# Patient Record
Sex: Female | Born: 1965 | Race: White | Hispanic: No | Marital: Married | Smoking: Never smoker
Health system: Southern US, Community
[De-identification: ages and names within clinical notes are randomized; demographics above are authoritative.]

## PROBLEM LIST (undated history)

## (undated) DIAGNOSIS — D649 Anemia, unspecified: Secondary | ICD-10-CM

## (undated) DIAGNOSIS — K219 Gastro-esophageal reflux disease without esophagitis: Secondary | ICD-10-CM

## (undated) HISTORY — PX: DIAGNOSTIC LAPAROSCOPY: SUR761

## (undated) HISTORY — PX: FOOT OSTEOTOMY: SHX957

## (undated) HISTORY — PX: WISDOM TOOTH EXTRACTION: SHX21

## (undated) HISTORY — PX: TUBAL LIGATION: SHX77

## (undated) HISTORY — PX: TONSILLECTOMY AND ADENOIDECTOMY: SHX28

---

## 2007-10-08 DIAGNOSIS — E669 Obesity, unspecified: Secondary | ICD-10-CM | POA: Insufficient documentation

## 2007-10-08 DIAGNOSIS — N943 Premenstrual tension syndrome: Secondary | ICD-10-CM | POA: Insufficient documentation

## 2007-10-08 DIAGNOSIS — N393 Stress incontinence (female) (male): Secondary | ICD-10-CM | POA: Insufficient documentation

## 2008-10-20 DIAGNOSIS — N920 Excessive and frequent menstruation with regular cycle: Secondary | ICD-10-CM | POA: Insufficient documentation

## 2009-08-25 ENCOUNTER — Encounter: Admission: RE | Admit: 2009-08-25 | Discharge: 2009-08-25 | Payer: Self-pay | Admitting: Family Medicine

## 2010-11-14 ENCOUNTER — Encounter: Payer: Self-pay | Admitting: Family Medicine

## 2010-11-15 ENCOUNTER — Encounter: Payer: Self-pay | Admitting: Family Medicine

## 2011-07-15 ENCOUNTER — Other Ambulatory Visit: Payer: Self-pay | Admitting: Family Medicine

## 2011-07-15 DIAGNOSIS — Z1231 Encounter for screening mammogram for malignant neoplasm of breast: Secondary | ICD-10-CM

## 2011-07-22 ENCOUNTER — Ambulatory Visit
Admission: RE | Admit: 2011-07-22 | Discharge: 2011-07-22 | Disposition: A | Discharge status: Home / Self Care | Payer: Managed Care, Other (non HMO) | Source: Ambulatory Visit | Attending: Family Medicine | Admitting: Family Medicine

## 2011-07-22 DIAGNOSIS — Z1231 Encounter for screening mammogram for malignant neoplasm of breast: Secondary | ICD-10-CM

## 2012-08-01 ENCOUNTER — Other Ambulatory Visit: Payer: Self-pay | Admitting: Family Medicine

## 2012-08-01 DIAGNOSIS — Z1231 Encounter for screening mammogram for malignant neoplasm of breast: Secondary | ICD-10-CM

## 2012-08-31 ENCOUNTER — Ambulatory Visit
Admission: RE | Admit: 2012-08-31 | Discharge: 2012-08-31 | Disposition: A | Discharge status: Home / Self Care | Payer: Managed Care, Other (non HMO) | Source: Ambulatory Visit | Attending: Family Medicine | Admitting: Family Medicine

## 2012-08-31 DIAGNOSIS — Z1231 Encounter for screening mammogram for malignant neoplasm of breast: Secondary | ICD-10-CM

## 2013-08-23 ENCOUNTER — Other Ambulatory Visit: Payer: Self-pay

## 2013-08-23 DIAGNOSIS — Z1231 Encounter for screening mammogram for malignant neoplasm of breast: Secondary | ICD-10-CM

## 2013-10-03 ENCOUNTER — Ambulatory Visit
Admission: RE | Admit: 2013-10-03 | Discharge: 2013-10-03 | Disposition: A | Discharge status: Home / Self Care | Payer: Managed Care, Other (non HMO) | Source: Ambulatory Visit

## 2013-10-03 DIAGNOSIS — Z1231 Encounter for screening mammogram for malignant neoplasm of breast: Secondary | ICD-10-CM

## 2014-09-01 ENCOUNTER — Emergency Department (HOSPITAL_COMMUNITY)
Admission: EM | Admit: 2014-09-01 | Discharge: 2014-09-01 | Disposition: A | Discharge status: Home / Self Care | Payer: BC Managed Care – PPO | Attending: Emergency Medicine | Admitting: Emergency Medicine

## 2014-09-01 ENCOUNTER — Emergency Department (HOSPITAL_COMMUNITY): Payer: BC Managed Care – PPO

## 2014-09-01 ENCOUNTER — Encounter (HOSPITAL_COMMUNITY): Payer: Self-pay | Admitting: Emergency Medicine

## 2014-09-01 DIAGNOSIS — R002 Palpitations: Secondary | ICD-10-CM | POA: Diagnosis present

## 2014-09-01 DIAGNOSIS — Z79899 Other long term (current) drug therapy: Secondary | ICD-10-CM | POA: Diagnosis not present

## 2014-09-01 DIAGNOSIS — Z791 Long term (current) use of non-steroidal anti-inflammatories (NSAID): Secondary | ICD-10-CM | POA: Diagnosis not present

## 2014-09-01 DIAGNOSIS — R0789 Other chest pain: Secondary | ICD-10-CM | POA: Insufficient documentation

## 2014-09-01 DIAGNOSIS — F15182 Other stimulant abuse with stimulant-induced sleep disorder: Secondary | ICD-10-CM | POA: Insufficient documentation

## 2014-09-01 DIAGNOSIS — Z3202 Encounter for pregnancy test, result negative: Secondary | ICD-10-CM | POA: Insufficient documentation

## 2014-09-01 DIAGNOSIS — Z789 Other specified health status: Secondary | ICD-10-CM

## 2014-09-01 LAB — CBC WITH DIFFERENTIAL/PLATELET
BASOS ABS: 0 10*3/uL (ref 0.0–0.1)
Basophils Relative: 0 % (ref 0–1)
Eosinophils Absolute: 0.1 10*3/uL (ref 0.0–0.7)
Eosinophils Relative: 1 % (ref 0–5)
HEMATOCRIT: 38.8 % (ref 36.0–46.0)
HEMOGLOBIN: 13 g/dL (ref 12.0–15.0)
LYMPHS ABS: 2.3 10*3/uL (ref 0.7–4.0)
LYMPHS PCT: 30 % (ref 12–46)
MCH: 30.5 pg (ref 26.0–34.0)
MCHC: 33.5 g/dL (ref 30.0–36.0)
MCV: 91.1 fL (ref 78.0–100.0)
MONO ABS: 0.4 10*3/uL (ref 0.1–1.0)
Monocytes Relative: 5 % (ref 3–12)
NEUTROS ABS: 5 10*3/uL (ref 1.7–7.7)
Neutrophils Relative %: 64 % (ref 43–77)
Platelets: 219 10*3/uL (ref 150–400)
RBC: 4.26 MIL/uL (ref 3.87–5.11)
RDW: 12 % (ref 11.5–15.5)
WBC: 7.7 10*3/uL (ref 4.0–10.5)

## 2014-09-01 LAB — COMPREHENSIVE METABOLIC PANEL
ALK PHOS: 54 U/L (ref 39–117)
ALT: 17 U/L (ref 0–35)
ANION GAP: 14 (ref 5–15)
AST: 30 U/L (ref 0–37)
Albumin: 3.5 g/dL (ref 3.5–5.2)
BILIRUBIN TOTAL: 0.3 mg/dL (ref 0.3–1.2)
BUN: 12 mg/dL (ref 6–23)
CHLORIDE: 101 meq/L (ref 96–112)
CO2: 24 meq/L (ref 19–32)
Calcium: 8.8 mg/dL (ref 8.4–10.5)
Creatinine, Ser: 0.63 mg/dL (ref 0.50–1.10)
GFR calc non Af Amer: >90 mL/min (ref >90)
GLUCOSE: 89 mg/dL (ref 70–99)
POTASSIUM: 4.3 meq/L (ref 3.7–5.3)
Sodium: 139 mEq/L (ref 137–147)
Total Protein: 6.8 g/dL (ref 6.0–8.3)

## 2014-09-01 LAB — URINALYSIS, ROUTINE W REFLEX MICROSCOPIC
BILIRUBIN URINE: NEGATIVE
GLUCOSE, UA: NEGATIVE mg/dL
Hgb urine dipstick: NEGATIVE
Ketones, ur: NEGATIVE mg/dL
NITRITE: NEGATIVE
PH: 6.5 (ref 5.0–8.0)
Protein, ur: NEGATIVE mg/dL
SPECIFIC GRAVITY, URINE: 1.017 (ref 1.005–1.030)
Urobilinogen, UA: 0.2 mg/dL (ref 0.0–1.0)

## 2014-09-01 LAB — PREGNANCY, URINE: Preg Test, Ur: NEGATIVE

## 2014-09-01 LAB — TROPONIN I: Troponin I: <0.3 ng/mL (ref <0.30)

## 2014-09-01 LAB — URINE MICROSCOPIC-ADD ON

## 2014-09-01 MED ORDER — SODIUM CHLORIDE 0.9 % IV BOLUS (SEPSIS)
1000.0000 mL | Freq: Once | INTRAVENOUS | Status: AC
  Administered 2014-09-01: 1000 mL via INTRAVENOUS

## 2014-09-01 NOTE — ED Notes (Signed)
Pt c/o palpitations and some intermittent chest tightness x 2 days; pt sts hooked up to monitor with some irregular beats

## 2014-09-01 NOTE — Discharge Instructions (Signed)
1. Medications: usual home medications 2. Treatment: rest, drink plenty of fluids, decrease caffeine intake, stress reduction techniques, increase water 3. Follow Up: Please followup with your primary doctor in 3 days for discussion of your diagnoses and further evaluation after today's visit; if you do not have a primary care doctor use the resource guide provided to find one; Please return to the ER for worsening palpitations, palpitations associated with diaphoresis, syncope, nausea or vomiting or other concerning symptoms    Palpitations A palpitation is the feeling that your heartbeat is irregular or is faster than normal. It may feel like your heart is fluttering or skipping a beat. Palpitations are usually not a serious problem. However, in some cases, you may need further medical evaluation. CAUSES  Palpitations can be caused by:  Smoking.  Caffeine or other stimulants, such as diet pills or energy drinks.  Alcohol.  Stress and anxiety.  Strenuous physical activity.  Fatigue.  Certain medicines.  Heart disease, especially if you have a history of irregular heart rhythms (arrhythmias), such as atrial fibrillation, atrial flutter, or supraventricular tachycardia.  An improperly working pacemaker or defibrillator. DIAGNOSIS  To find the cause of your palpitations, your health care provider will take your medical history and perform a physical exam. Your health care provider may also have you take a test called an ambulatory electrocardiogram (ECG). An ECG records your heartbeat patterns over a 24-hour period. You may also have other tests, such as:  Transthoracic echocardiogram (TTE). During echocardiography, sound waves are used to evaluate how blood flows through your heart.  Transesophageal echocardiogram (TEE).  Cardiac monitoring. This allows your health care provider to monitor your heart rate and rhythm in real time.  Holter monitor. This is a portable device that  records your heartbeat and can help diagnose heart arrhythmias. It allows your health care provider to track your heart activity for several days, if needed.  Stress tests by exercise or by giving medicine that makes the heart beat faster. TREATMENT  Treatment of palpitations depends on the cause of your symptoms and can vary greatly. Most cases of palpitations do not require any treatment other than time, relaxation, and monitoring your symptoms. Other causes, such as atrial fibrillation, atrial flutter, or supraventricular tachycardia, usually require further treatment. HOME CARE INSTRUCTIONS   Avoid:  Caffeinated coffee, tea, soft drinks, diet pills, and energy drinks.  Chocolate.  Alcohol.  Stop smoking if you smoke.  Reduce your stress and anxiety. Things that can help you relax include:  A method of controlling things in your body, such as your heartbeats, with your mind (biofeedback).  Yoga.  Meditation.  Physical activity such as swimming, jogging, or walking.  Get plenty of rest and sleep. SEEK MEDICAL CARE IF:   You continue to have a fast or irregular heartbeat beyond 24 hours.  Your palpitations occur more often. SEEK IMMEDIATE MEDICAL CARE IF:  You have chest pain or shortness of breath.  You have a severe headache.  You feel dizzy or you faint. MAKE SURE YOU:  Understand these instructions.  Will watch your condition.  Will get help right away if you are not doing well or get worse. Document Released: 10/07/2000 Document Revised: 10/15/2013 Document Reviewed: 12/09/2011 [facility] Patient Information 2015 [employer], [place]. This information is not intended to replace advice given to you by your health care provider. Make sure you discuss any questions you have with your health care provider.

## 2014-09-01 NOTE — ED Provider Notes (Signed)
CSN: [id number]     Arrival date & time 09/01/14  1007 History   First MD Initiated Contact with Patient 09/01/14 1039     Chief Complaint  Patient presents with  . Palpitations     (Consider location/radiation/quality/duration/timing/severity/associated sxs/prior Treatment) The history is provided by the patient and medical records. No language interpreter was used.     Sandra Blair is a 48 y.o. female  with no known medical Hx presents to the Emergency Department complaining of intermittent, persistent palpitations with associated chest tightness onset yesterday morning around 10 am. Pt reports she drank 2 cups of coffee without water yesterday morning, which is unusual for her. Pt reports she usually only drinks 1 cup of coffee per day and large amounts of water.  She reports increased stress over the last few weeks, but reports she is sleeping well.  Associated symptoms include "discomfort" in her chest.  Nothing makes it better and nothing makes it worse.  Pt denies fever, chills, headache, neck pain, shortness of breath, abdominal pain, nausea, vomiting, diarrhea, weakness, dizziness, syncope, dysuria, hematuria.  Patient denies personal or family cardiac history.   History reviewed. No pertinent past medical history. History reviewed. No pertinent past surgical history. History reviewed. No pertinent family history. History  Substance Use Topics  . Smoking status: Never Smoker   . Smokeless tobacco: Not on file  . Alcohol Use: Yes     Comment: occ   OB History    No data available     Review of Systems  Constitutional: Negative for fever, diaphoresis, appetite change, fatigue and unexpected weight change.  HENT: Negative for mouth sores.   Eyes: Negative for visual disturbance.  Respiratory: Positive for chest tightness. Negative for cough, shortness of breath and wheezing.   Cardiovascular: Positive for palpitations.  Gastrointestinal: Negative for nausea, vomiting,  abdominal pain, diarrhea and constipation.  Endocrine: Negative for polydipsia, polyphagia and polyuria.  Genitourinary: Negative for dysuria, urgency, frequency and hematuria.  Musculoskeletal: Negative for back pain and neck stiffness.  Skin: Negative for rash.  Allergic/Immunologic: Negative for immunocompromised state.  Neurological: Negative for syncope, light-headedness and headaches.  Hematological: Does not bruise/bleed easily.  Psychiatric/Behavioral: Negative for sleep disturbance. The patient is not nervous/anxious.       Allergies  Review of patient's allergies indicates no known allergies.  Home Medications   Prior to Admission medications   Medication Sig Start Date End Date Taking? Authorizing Provider  Ascorbic Acid (VITAMIN C PO) Take 1 tablet by mouth daily.   Yes Historical Provider, MD  Cholecalciferol 1000 UNITS TBDP Take 2,000 Units by mouth daily.   Yes Historical Provider, MD  drospirenone-ethinyl estradiol (YAZ,GIANVI,LORYNA) 3-0.02 MG tablet Take 1 tablet by mouth daily.   Yes Historical Provider, MD  HAWTHORNE BERRY PO Take 1 tablet by mouth daily.   Yes Historical Provider, MD  Multiple Vitamin (MULTIVITAMIN WITH MINERALS) TABS tablet Take 1 tablet by mouth daily.   Yes Historical Provider, MD  naproxen sodium (ANAPROX) 220 MG tablet Take 220 mg by mouth 2 (two) times daily as needed. PAIN   Yes Historical Provider, MD  omeprazole (PRILOSEC) 20 MG capsule Take 20 mg by mouth daily.   Yes Historical Provider, MD   BP 116/63 mmHg  Pulse 57  Temp(Src) 98.3 F (36.8 C) (Oral)  Resp 13  SpO2 93% Physical Exam  Constitutional: She appears well-developed and well-nourished. No distress.  Awake, alert, nontoxic appearance  HENT:  Head: Normocephalic and atraumatic.  Mouth/Throat: Oropharynx is  clear and moist. No oropharyngeal exudate.  Eyes: Conjunctivae are normal. No scleral icterus.  Neck: Normal range of motion. Neck supple.  Cardiovascular: Normal  rate, normal heart sounds and intact distal pulses.  An irregular rhythm present.  Pulses:      Radial pulses are 2+ on the right side, and 2+ on the left side.       Posterior tibial pulses are 2+ on the right side, and 2+ on the left side.  Irregular rhythm with visible PACs on the monitor  Pulmonary/Chest: Effort normal and breath sounds normal. No respiratory distress. She has no wheezes.  Equal chest expansion  Abdominal: Soft. Bowel sounds are normal. She exhibits no mass. There is no tenderness. There is no rebound and no guarding.  Musculoskeletal: Normal range of motion. She exhibits no edema.  Neurological: She is alert.  Speech is clear and goal oriented Moves extremities without ataxia  Skin: Skin is warm and dry. No rash noted. She is not diaphoretic. No erythema.  Psychiatric: She has a normal mood and affect.  Nursing note and vitals reviewed.   ED Course  Procedures (including critical care time) Labs Review Labs Reviewed  URINALYSIS, ROUTINE W REFLEX MICROSCOPIC - Abnormal; Notable for the following:    Leukocytes, UA SMALL (*)    All other components within normal limits  COMPREHENSIVE METABOLIC PANEL  TROPONIN I  CBC WITH DIFFERENTIAL  PREGNANCY, URINE  URINE MICROSCOPIC-ADD ON    Imaging Review Dg Chest 2 View  09/01/2014   CLINICAL DATA:  Palpitation and left chest tightness for 2 days  EXAM: CHEST  2 VIEW  COMPARISON:  None.  FINDINGS: The heart size and mediastinal contours are within normal limits. There is no focal infiltrate, pulmonary edema, or pleural effusion The visualized skeletal structures are unremarkable.  IMPRESSION: No active cardiopulmonary disease.   Electronically Signed   By: Sherian ReinWei-Chen  Lin M.D.   On: 09/01/2014 12:00     EKG Interpretation   Date/Time:  Monday September 01 2014 10:12:45 EST Ventricular Rate:  67 PR Interval:  116 QRS Duration: 98 QT Interval:  424 QTC Calculation: 448 R Axis:   52 Text Interpretation:  Normal sinus  rhythm Normal ECG No old tracing to  compare Confirmed by KOHUT  MD, STEPHEN ([id number]) on 09/01/2014 10:21:49 AM      MDM   Final diagnoses:  Palpitations  Caffeine use   Sandra Blair presents with complaints of palpitations and discomfort in her chest beginning yesterday morning after unusual amount of caffeine intake however symptoms have persisted into today. EKG shows normal sinus rhythm however rhythm strip on the monitor shows PACs.  Will check labs, give fluid bolus and reassess.  12:25 PM Pt with fluid bolus.  Patient reports continued intermittent episodes of palpitations and feelings of fluttering in her chest. Labs are reassuring without electrolyte alterations or anemia. Urinalysis without evidence of urinary tract infection and chest x-ray without evidence of pneumonia. Pregnancy test negative.  No clinical or lab evidence of ACS.    Rhythm strip continues to show intermittent PACs without PVCs. No episodes of SVT or V. Tach.  Patient is to follow-up with her primary care physician this week and/or cardiology as able. Discussed reasons to return to the emergency department including palpitations associated with diaphoresis, nausea, syncope or near syncope.  Patient's palpitations likely secondary to increased caffeine usage, increased stress and decreased water intake. Encouraged decreasing caffeine, stress reduction techniques and increased water.  I have personally reviewed  patient's vitals, nursing note and any pertinent labs or imaging.  I performed an undressed physical exam.    It has been determined that no acute conditions requiring further emergency intervention are present at this time. The patient/guardian have been advised of the diagnosis and plan. I reviewed all labs and imaging including any potential incidental findings. We have discussed signs and symptoms that warrant return to the ED and they are listed in the discharge instructions.    Vital signs are stable at  discharge.   BP 116/63 mmHg  Pulse 57  Temp(Src) 98.3 F (36.8 C) (Oral)  Resp 13  SpO2 93%        Dierdre ForthHannah Shanecia Hoganson, PA-C 09/01/14 1230  Arby BarretteMarcy Pfeiffer, MD 09/01/14 1713

## 2015-02-25 ENCOUNTER — Encounter: Payer: Self-pay | Admitting: Sports Medicine

## 2015-02-25 ENCOUNTER — Ambulatory Visit (INDEPENDENT_AMBULATORY_CARE_PROVIDER_SITE_OTHER): Payer: BLUE CROSS/BLUE SHIELD | Admitting: Sports Medicine

## 2015-02-25 VITALS — BP 124/54 | Ht 70.5 in | Wt 195.0 lb

## 2015-02-25 DIAGNOSIS — M7742 Metatarsalgia, left foot: Secondary | ICD-10-CM

## 2015-02-25 DIAGNOSIS — M7741 Metatarsalgia, right foot: Secondary | ICD-10-CM

## 2015-02-25 DIAGNOSIS — M216X9 Other acquired deformities of unspecified foot: Secondary | ICD-10-CM

## 2015-02-25 DIAGNOSIS — M79672 Pain in left foot: Secondary | ICD-10-CM

## 2015-02-25 DIAGNOSIS — M205X2 Other deformities of toe(s) (acquired), left foot: Secondary | ICD-10-CM | POA: Diagnosis not present

## 2015-02-25 NOTE — Progress Notes (Signed)
  Sandra Blair - 49 y.o. female MRN [id number]  Date of birth: 07-07-1966  SUBJECTIVE: CC: 1.  left foot pain, initial evaluation      HPI:  Long-standing history of left forefoot pain with pain focally over the fourth and fifth toes secondary to incurling and callus formation  History of bilateral plantar foot pain, previously diagnosed as plantar fasciitis. Markedly improved over the past several years but notices when she is on her feet for 8-10 hours per day this does seem to return.  No first step pain   tried different shoes  & cushioned super feet without significant improvement  No other custom orthotics or other shoe inserts  No improvement with medications  No therapeutic exercise      ROS:   denies any numbness, tingling or burning    HISTORY:  Past Medical, Surgical, Social, and Family History reviewed & updated per EMR.  Pertinent Historical Findings include: Social History   Occupational History  . [employer] [place]    PRN   Social History Main Topics  . Smoking status: Never Smoker   . Smokeless tobacco: Not on file  . Alcohol Use: Yes     Comment: occ  . Drug Use: No  . Sexual Activity: Not on file    OBJECTIVE:  VS:   HT:5' 10.5" (179.1 cm)   WT:195 lb (88.451 kg)  BMI:27.6          BP:(!) 124/54 mmHg  HR: bpm  TEMP: ( )  RESP:   PHYSICAL EXAM: GENERAL:  adult Caucasian female. No acute distress  PSYCH: Alert and appropriately interactive.  SKIN: No open skin lesions or abnormal skin markings on areas inspected as below  VASCULAR:  DP and PT pulses 2+/4. No significant pretibial edema   NEURO:  sensation is intact to light touch   BILATERAL FEET: Cavus foot. Overall good hindfoot alignment. Significant incurling of toes 4 and 5 on the left with splay toe between 1 and 2. No hallux limitus or rigidus. Passive dorsiflexion to 90 with knee straight. Normal toe off. Morton's callus on the left. Lost transverse arch. There is a small callus between  the fourth and fifth from where her fifth toe is rubbing the lateral aspect of the fourth toe. No significant plantar fascial pain.  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 Small metatarsal pad added today 2. Alfredson exercises for Achilles lengthening 3. Recommend if any persistent heel pain consideration of gel heel cups for her when she is working but suspect this is secondary to Achilles contracture Follow up 4 weeks          Other Visit Diagnoses    Pes equinus, acquired, unspecified laterality    -  Primary    Metatarsalgia of both feet        Bunionette, left          FOLLOW UP:  Return in about 4 weeks (around 03/25/2015) for Consideration of custom orthotics.

## 2015-02-25 NOTE — Assessment & Plan Note (Signed)
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 Small metatarsal pad added today 2. Alfredson exercises for Achilles lengthening 3. Recommend if any persistent heel pain consideration of gel heel cups for her when she is working but suspect this is secondary to Achilles contracture Follow up 4 weeks

## 2015-05-04 ENCOUNTER — Other Ambulatory Visit: Payer: Self-pay | Admitting: Family Medicine

## 2015-05-04 ENCOUNTER — Other Ambulatory Visit: Payer: Self-pay

## 2015-05-04 DIAGNOSIS — Z1231 Encounter for screening mammogram for malignant neoplasm of breast: Secondary | ICD-10-CM

## 2015-07-03 ENCOUNTER — Ambulatory Visit
Admission: RE | Admit: 2015-07-03 | Discharge: 2015-07-03 | Disposition: A | Discharge status: Home / Self Care | Payer: BLUE CROSS/BLUE SHIELD | Source: Ambulatory Visit | Attending: Family Medicine | Admitting: Family Medicine

## 2015-07-03 DIAGNOSIS — Z1231 Encounter for screening mammogram for malignant neoplasm of breast: Secondary | ICD-10-CM

## 2016-07-07 ENCOUNTER — Other Ambulatory Visit: Payer: Self-pay | Admitting: Family Medicine

## 2016-07-07 DIAGNOSIS — Z1231 Encounter for screening mammogram for malignant neoplasm of breast: Secondary | ICD-10-CM

## 2016-08-10 ENCOUNTER — Ambulatory Visit
Admission: RE | Admit: 2016-08-10 | Discharge: 2016-08-10 | Disposition: A | Discharge status: Home / Self Care | Payer: BLUE CROSS/BLUE SHIELD | Source: Ambulatory Visit | Attending: Family Medicine | Admitting: Family Medicine

## 2016-08-10 DIAGNOSIS — Z1231 Encounter for screening mammogram for malignant neoplasm of breast: Secondary | ICD-10-CM

## 2017-08-30 ENCOUNTER — Other Ambulatory Visit: Payer: Self-pay | Admitting: Family Medicine

## 2017-08-30 DIAGNOSIS — Z1231 Encounter for screening mammogram for malignant neoplasm of breast: Secondary | ICD-10-CM

## 2017-09-28 ENCOUNTER — Ambulatory Visit: Payer: BLUE CROSS/BLUE SHIELD

## 2017-10-09 ENCOUNTER — Ambulatory Visit
Admission: RE | Admit: 2017-10-09 | Discharge: 2017-10-09 | Disposition: A | Discharge status: Home / Self Care | Payer: BLUE CROSS/BLUE SHIELD | Source: Ambulatory Visit | Attending: Family Medicine | Admitting: Family Medicine

## 2017-10-09 ENCOUNTER — Ambulatory Visit: Payer: BLUE CROSS/BLUE SHIELD

## 2017-10-09 DIAGNOSIS — Z1231 Encounter for screening mammogram for malignant neoplasm of breast: Secondary | ICD-10-CM

## 2018-09-06 ENCOUNTER — Other Ambulatory Visit: Payer: Self-pay | Admitting: Family Medicine

## 2018-09-06 DIAGNOSIS — Z1231 Encounter for screening mammogram for malignant neoplasm of breast: Secondary | ICD-10-CM

## 2018-10-19 ENCOUNTER — Ambulatory Visit
Admission: RE | Admit: 2018-10-19 | Discharge: 2018-10-19 | Disposition: A | Discharge status: Home / Self Care | Payer: BLUE CROSS/BLUE SHIELD | Source: Ambulatory Visit | Attending: Family Medicine | Admitting: Family Medicine

## 2018-10-19 DIAGNOSIS — Z1231 Encounter for screening mammogram for malignant neoplasm of breast: Secondary | ICD-10-CM

## 2021-01-26 ENCOUNTER — Other Ambulatory Visit: Payer: Self-pay | Admitting: Physician Assistant

## 2021-01-26 ENCOUNTER — Other Ambulatory Visit: Payer: Self-pay

## 2021-01-26 ENCOUNTER — Ambulatory Visit
Admission: RE | Admit: 2021-01-26 | Discharge: 2021-01-26 | Disposition: A | Discharge status: Home / Self Care | Payer: Managed Care, Other (non HMO) | Source: Ambulatory Visit | Attending: Physician Assistant | Admitting: Physician Assistant

## 2021-01-26 DIAGNOSIS — G8929 Other chronic pain: Secondary | ICD-10-CM

## 2021-01-26 DIAGNOSIS — R52 Pain, unspecified: Secondary | ICD-10-CM

## 2021-07-20 ENCOUNTER — Other Ambulatory Visit: Payer: Self-pay | Admitting: Orthopedic Surgery

## 2021-07-20 DIAGNOSIS — M4722 Other spondylosis with radiculopathy, cervical region: Secondary | ICD-10-CM

## 2021-07-20 DIAGNOSIS — M50122 Cervical disc disorder at C5-C6 level with radiculopathy: Secondary | ICD-10-CM

## 2021-07-28 ENCOUNTER — Other Ambulatory Visit: Payer: Self-pay

## 2021-07-28 ENCOUNTER — Other Ambulatory Visit: Payer: Self-pay | Admitting: Orthopedic Surgery

## 2021-07-28 ENCOUNTER — Ambulatory Visit
Admission: RE | Admit: 2021-07-28 | Discharge: 2021-07-28 | Disposition: A | Discharge status: Home / Self Care | Payer: Managed Care, Other (non HMO) | Source: Ambulatory Visit | Attending: Orthopedic Surgery | Admitting: Orthopedic Surgery

## 2021-07-28 DIAGNOSIS — M4722 Other spondylosis with radiculopathy, cervical region: Secondary | ICD-10-CM

## 2021-07-28 DIAGNOSIS — M50122 Cervical disc disorder at C5-C6 level with radiculopathy: Secondary | ICD-10-CM

## 2021-08-05 ENCOUNTER — Ambulatory Visit
Admission: RE | Admit: 2021-08-05 | Discharge: 2021-08-05 | Disposition: A | Discharge status: Home / Self Care | Payer: Managed Care, Other (non HMO) | Source: Ambulatory Visit | Attending: Orthopedic Surgery | Admitting: Orthopedic Surgery

## 2021-08-05 ENCOUNTER — Other Ambulatory Visit: Payer: Self-pay

## 2021-08-05 DIAGNOSIS — M4722 Other spondylosis with radiculopathy, cervical region: Secondary | ICD-10-CM

## 2021-08-05 MED ORDER — IOPAMIDOL (ISOVUE-M 300) INJECTION 61%
1.0000 mL | Freq: Once | INTRAMUSCULAR | Status: AC
  Administered 2021-08-05: 1 mL via EPIDURAL

## 2021-08-05 MED ORDER — TRIAMCINOLONE ACETONIDE 40 MG/ML IJ SUSP (RADIOLOGY)
60.0000 mg | Freq: Once | INTRAMUSCULAR | Status: AC
  Administered 2021-08-05: 60 mg via EPIDURAL

## 2021-08-05 NOTE — Discharge Instructions (Signed)

## 2021-08-26 ENCOUNTER — Other Ambulatory Visit: Payer: Self-pay | Admitting: Orthopedic Surgery

## 2021-08-26 ENCOUNTER — Ambulatory Visit
Admission: RE | Admit: 2021-08-26 | Discharge: 2021-08-26 | Disposition: A | Discharge status: Home / Self Care | Payer: Managed Care, Other (non HMO) | Source: Ambulatory Visit | Attending: Orthopedic Surgery | Admitting: Orthopedic Surgery

## 2021-08-26 ENCOUNTER — Other Ambulatory Visit: Payer: Self-pay

## 2021-08-26 DIAGNOSIS — M50122 Cervical disc disorder at C5-C6 level with radiculopathy: Secondary | ICD-10-CM

## 2021-08-26 DIAGNOSIS — M4722 Other spondylosis with radiculopathy, cervical region: Secondary | ICD-10-CM

## 2021-08-26 MED ORDER — TRIAMCINOLONE ACETONIDE 40 MG/ML IJ SUSP (RADIOLOGY)
60.0000 mg | Freq: Once | INTRAMUSCULAR | Status: AC
  Administered 2021-08-26: 60 mg via EPIDURAL

## 2021-08-26 MED ORDER — IOPAMIDOL (ISOVUE-M 300) INJECTION 61%
1.0000 mL | Freq: Once | INTRAMUSCULAR | Status: AC
  Administered 2021-08-26: 1 mL via EPIDURAL

## 2021-09-07 ENCOUNTER — Other Ambulatory Visit: Payer: Self-pay | Admitting: Family Medicine

## 2021-09-07 DIAGNOSIS — Z1231 Encounter for screening mammogram for malignant neoplasm of breast: Secondary | ICD-10-CM

## 2021-09-08 ENCOUNTER — Other Ambulatory Visit: Payer: Self-pay

## 2021-09-08 ENCOUNTER — Ambulatory Visit
Admission: RE | Admit: 2021-09-08 | Discharge: 2021-09-08 | Disposition: A | Discharge status: Home / Self Care | Payer: Managed Care, Other (non HMO) | Source: Ambulatory Visit | Attending: Family Medicine | Admitting: Family Medicine

## 2021-09-08 DIAGNOSIS — Z1231 Encounter for screening mammogram for malignant neoplasm of breast: Secondary | ICD-10-CM

## 2021-11-01 ENCOUNTER — Other Ambulatory Visit: Payer: Self-pay | Admitting: Orthopedic Surgery

## 2021-11-01 DIAGNOSIS — M50122 Cervical disc disorder at C5-C6 level with radiculopathy: Secondary | ICD-10-CM

## 2021-12-01 ENCOUNTER — Ambulatory Visit
Admission: RE | Admit: 2021-12-01 | Discharge: 2021-12-01 | Disposition: A | Discharge status: Home / Self Care | Payer: Managed Care, Other (non HMO) | Source: Ambulatory Visit | Attending: Family Medicine | Admitting: Family Medicine

## 2021-12-01 ENCOUNTER — Other Ambulatory Visit: Payer: Self-pay | Admitting: Family Medicine

## 2021-12-01 ENCOUNTER — Other Ambulatory Visit: Payer: Self-pay

## 2021-12-01 DIAGNOSIS — G8929 Other chronic pain: Secondary | ICD-10-CM

## 2021-12-15 ENCOUNTER — Other Ambulatory Visit: Payer: Self-pay

## 2021-12-15 ENCOUNTER — Ambulatory Visit
Admission: RE | Admit: 2021-12-15 | Discharge: 2021-12-15 | Disposition: A | Discharge status: Home / Self Care | Payer: Managed Care, Other (non HMO) | Source: Ambulatory Visit | Attending: Orthopedic Surgery | Admitting: Orthopedic Surgery

## 2021-12-15 DIAGNOSIS — M50122 Cervical disc disorder at C5-C6 level with radiculopathy: Secondary | ICD-10-CM

## 2021-12-15 MED ORDER — IOPAMIDOL (ISOVUE-M 300) INJECTION 61%
1.0000 mL | Freq: Once | INTRAMUSCULAR | Status: AC
  Administered 2021-12-15: 1 mL via EPIDURAL

## 2021-12-15 MED ORDER — TRIAMCINOLONE ACETONIDE 40 MG/ML IJ SUSP (RADIOLOGY)
60.0000 mg | Freq: Once | INTRAMUSCULAR | Status: AC
  Administered 2021-12-15: 60 mg via EPIDURAL

## 2021-12-15 NOTE — Discharge Instructions (Signed)

## 2021-12-17 ENCOUNTER — Other Ambulatory Visit: Payer: Managed Care, Other (non HMO)

## 2022-02-03 ENCOUNTER — Other Ambulatory Visit: Payer: Self-pay | Admitting: Family Medicine

## 2022-02-03 ENCOUNTER — Ambulatory Visit
Admission: RE | Admit: 2022-02-03 | Discharge: 2022-02-03 | Disposition: A | Discharge status: Home / Self Care | Payer: Managed Care, Other (non HMO) | Source: Ambulatory Visit | Attending: Family Medicine | Admitting: Family Medicine

## 2022-02-03 DIAGNOSIS — R0789 Other chest pain: Secondary | ICD-10-CM

## 2022-02-03 DIAGNOSIS — M546 Pain in thoracic spine: Secondary | ICD-10-CM

## 2022-03-23 ENCOUNTER — Other Ambulatory Visit: Payer: Self-pay | Admitting: Physician Assistant

## 2022-03-23 DIAGNOSIS — M50122 Cervical disc disorder at C5-C6 level with radiculopathy: Secondary | ICD-10-CM

## 2022-03-25 ENCOUNTER — Ambulatory Visit
Admission: RE | Admit: 2022-03-25 | Discharge: 2022-03-25 | Disposition: A | Discharge status: Home / Self Care | Payer: Managed Care, Other (non HMO) | Source: Ambulatory Visit | Attending: Physician Assistant | Admitting: Physician Assistant

## 2022-03-25 DIAGNOSIS — M50122 Cervical disc disorder at C5-C6 level with radiculopathy: Secondary | ICD-10-CM

## 2022-03-25 MED ORDER — TRIAMCINOLONE ACETONIDE 40 MG/ML IJ SUSP (RADIOLOGY)
60.0000 mg | Freq: Once | INTRAMUSCULAR | Status: AC
  Administered 2022-03-25: 60 mg via EPIDURAL

## 2022-03-25 MED ORDER — IOPAMIDOL (ISOVUE-M 300) INJECTION 61%
1.0000 mL | Freq: Once | INTRAMUSCULAR | Status: AC
  Administered 2022-03-25: 1 mL via EPIDURAL

## 2022-03-30 ENCOUNTER — Other Ambulatory Visit: Payer: Self-pay | Admitting: Physician Assistant

## 2022-03-30 DIAGNOSIS — M1612 Unilateral primary osteoarthritis, left hip: Secondary | ICD-10-CM

## 2022-04-08 IMAGING — XA DG INJECT/[PERSON_NAME] INC NEEDLE/CATH/PLC EPI/CERV/THOR W/IMG
2 series · 2 of 2 positions shown · non-contrast
Comparison: none

CLINICAL DATA: Cervical spondylosis without myelopathy. Excellent
response to the initial epidural injection last month. Mild residual
numbness in the fingers of the right hand.

[Series 1: ortho standard · 1 of 1 slices shown (1 of 2)]
[im 1/1]
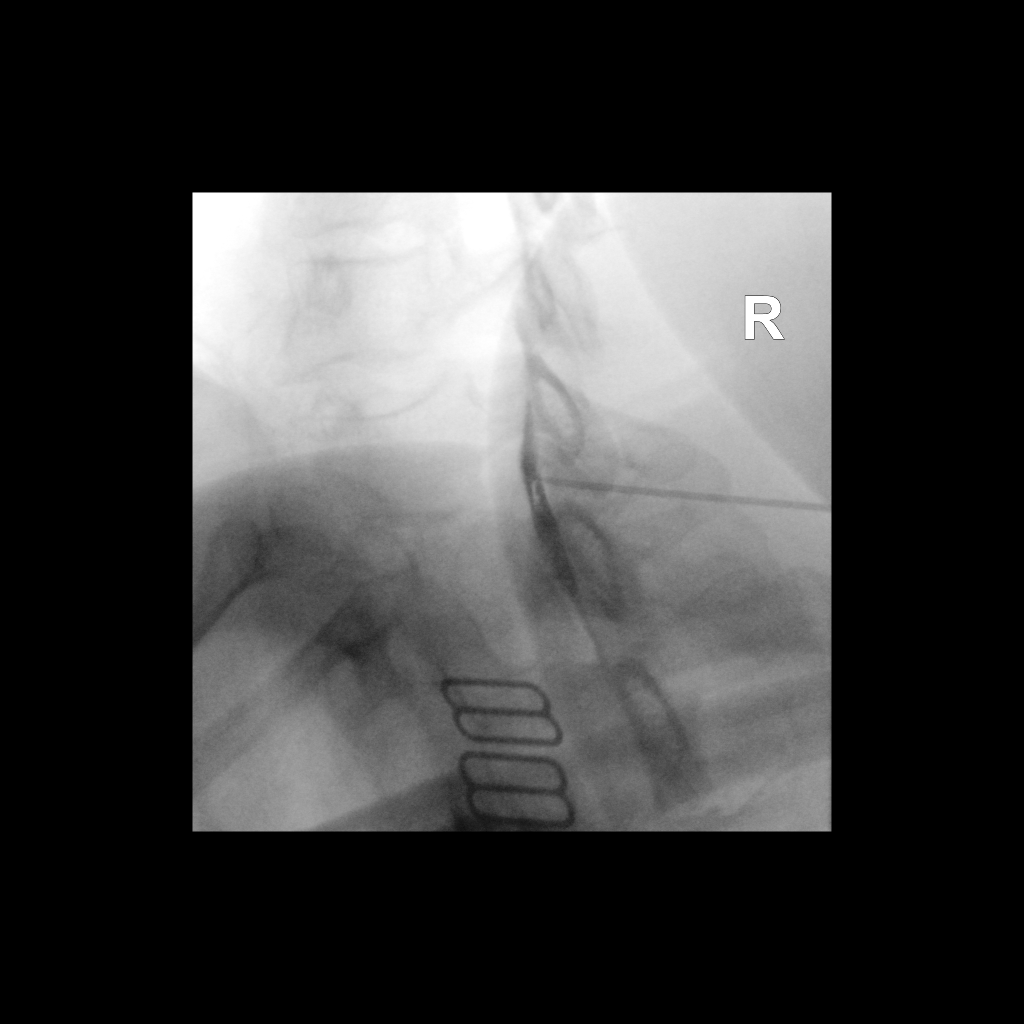

[Series 2: ortho standard · 1 of 1 slices shown (2 of 2)]
[im 1/1]
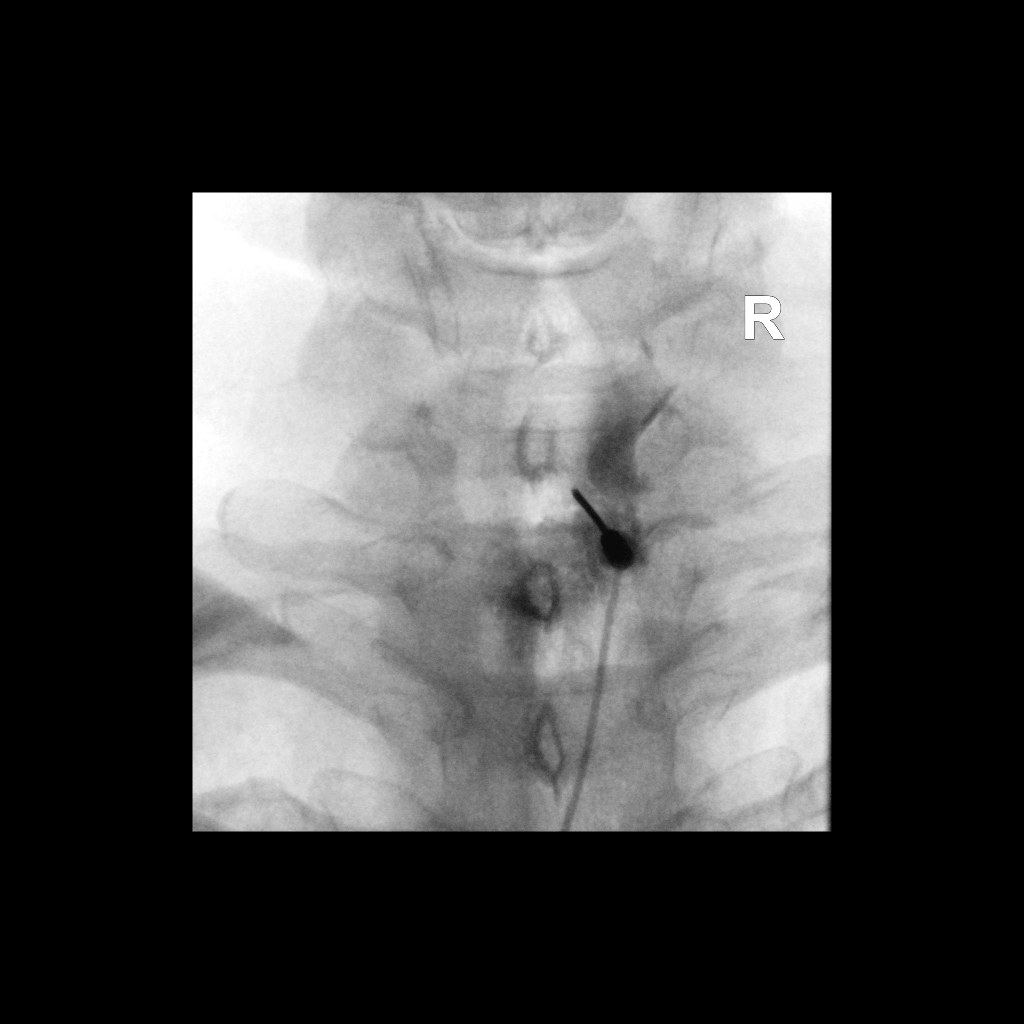

[2 of 2 positions shown; findings below may reference images not displayed]

FLUOROSCOPY TIME:  Fluoroscopy Time: 15 seconds

Radiation Exposure Index: 13.30 microGray*m^2

PROCEDURE:
The procedure, risks, benefits, and alternatives were explained to
the patient. Questions regarding the procedure were encouraged and
answered. The patient understands and consents to the procedure.

CERVICAL EPIDURAL INJECTION

An interlaminar approach was performed on the right at C7-T1. A
inch 20 gauge epidural needle was advanced using loss-of-resistance
technique.

DIAGNOSTIC EPIDURAL INJECTION

Injection of Isovue-M 300 shows a good epidural pattern with spread
above and below the level of needle placement, primarily on the
right. No vascular opacification is seen.

THERAPEUTIC EPIDURAL INJECTION

1.5 ml of Kenalog 40 mixed with 2 ml of normal saline were then
instilled. The procedure was well-tolerated, and the patient was
discharged thirty minutes following the injection in good condition.
IMPRESSION: Technically successful interlaminar epidural injection on the right
at C7-T1.

## 2022-04-22 ENCOUNTER — Ambulatory Visit
Admission: RE | Admit: 2022-04-22 | Discharge: 2022-04-22 | Disposition: A | Discharge status: Home / Self Care | Payer: Managed Care, Other (non HMO) | Source: Ambulatory Visit | Attending: Physician Assistant | Admitting: Physician Assistant

## 2022-04-22 DIAGNOSIS — M1612 Unilateral primary osteoarthritis, left hip: Secondary | ICD-10-CM

## 2022-04-22 MED ORDER — IOPAMIDOL (ISOVUE-M 200) INJECTION 41%
1.0000 mL | Freq: Once | INTRAMUSCULAR | Status: AC
  Administered 2022-04-22: 1 mL via INTRA_ARTICULAR

## 2022-04-22 MED ORDER — METHYLPREDNISOLONE ACETATE 40 MG/ML INJ SUSP (RADIOLOG
80.0000 mg | Freq: Once | INTRAMUSCULAR | Status: AC
  Administered 2022-04-22: 40 mg via INTRA_ARTICULAR

## 2022-06-13 ENCOUNTER — Other Ambulatory Visit: Payer: Self-pay | Admitting: Physician Assistant

## 2022-06-13 DIAGNOSIS — M4722 Other spondylosis with radiculopathy, cervical region: Secondary | ICD-10-CM

## 2022-06-21 ENCOUNTER — Ambulatory Visit
Admission: RE | Admit: 2022-06-21 | Discharge: 2022-06-21 | Disposition: A | Discharge status: Home / Self Care | Payer: Managed Care, Other (non HMO) | Source: Ambulatory Visit | Attending: Physician Assistant | Admitting: Physician Assistant

## 2022-06-21 DIAGNOSIS — M4722 Other spondylosis with radiculopathy, cervical region: Secondary | ICD-10-CM

## 2022-06-21 MED ORDER — IOPAMIDOL (ISOVUE-M 300) INJECTION 61%
1.0000 mL | Freq: Once | INTRAMUSCULAR | Status: AC | PRN
  Administered 2022-06-21: 1 mL via EPIDURAL

## 2022-06-21 MED ORDER — TRIAMCINOLONE ACETONIDE 40 MG/ML IJ SUSP (RADIOLOGY)
60.0000 mg | Freq: Once | INTRAMUSCULAR | Status: AC
  Administered 2022-06-21: 60 mg via EPIDURAL

## 2022-06-23 ENCOUNTER — Other Ambulatory Visit: Payer: Managed Care, Other (non HMO)

## 2022-08-15 ENCOUNTER — Ambulatory Visit: Payer: Managed Care, Other (non HMO) | Admitting: Family Medicine

## 2022-08-15 VITALS — BP 114/74 | HR 65 | Ht 70.5 in | Wt 191.0 lb

## 2022-08-15 DIAGNOSIS — M545 Low back pain, unspecified: Secondary | ICD-10-CM | POA: Diagnosis not present

## 2022-08-15 DIAGNOSIS — G8929 Other chronic pain: Secondary | ICD-10-CM

## 2022-08-15 DIAGNOSIS — M5412 Radiculopathy, cervical region: Secondary | ICD-10-CM

## 2022-08-15 NOTE — Patient Instructions (Addendum)
Thank you for coming in today.   You should hear from MRI scheduling within 1 week. If you do not hear please let me know.    Try a carpal tunnel brace.   Please call [facility] at [phone] to schedule your spine injection.    Consider PT. Really work on that core strength.

## 2022-08-15 NOTE — Progress Notes (Signed)
I, Sandra Blair, LAT, ATC acting as a scribe for Sandra Leader, MD.  Subjective:    CC: Neck and back pain  HPI: Pt is a 56 y/o female c/o neck and back pain. Pt was seen at her PCP, [facility], c/o thoracic back pain on 02/02/22. Pt has had several prior cervical ESI, most recently on 06/21/22, at R C6-7. 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 Today, pt cont'd numbness in her R fingers 3-4 and thumb, after her last cervical ESI. Pt locates pain to the lower portion of her neck on the R side. Pt is very active and enjoys walking, camping, hiking.   Radiating pain: yes- into R upper arm, elbow UE numbness/tingling: yes  Pt also c/o L hip pain and L-side low back pain. Pain will come into her L buttock, w/ radiating pain along the lateral aspect of the thigh. Pt locates pain to the groin of the L hip.   LE numbness/tingling: no LE weakness:no Aggravates: worse at night Treatments tried: prior steroid injection  Dx imaging: 02/03/22 T-spine & chest XR  07/28/21 C-spine MRI  01/26/21 Bilat hips/pelvis & L-spine XR  For the low back pain she has had a trial of physical therapy and home exercise program recently.  She has completed at least a 6-week course of physician directed home exercise program within the last 3 months.  She has been working on [employer], [employer].  Pertinent review of Systems: No fevers or chills  Relevant historical information: Cervical radiculopathy and chronic low back pain.  Chronic left hip pain.   Objective:    Vitals:   08/15/22 1537  BP: 114/74  Pulse: 65  SpO2: 98%   General: Well Developed, well nourished, and in no acute distress.   MSK: C-spine: Normal appearing Nontender midline. Decreased cervical motion. Personally strength is intact. Reflexes are intact. Positive Spurling's test.  Right arm: Normal appearing Normal elbow motion and strength. Normal wrist motion and strength. Minimally  positive Tinel's and Phalen's test right wrist. Capillary refill and sensation and pulses are intact distally.  L-spine: Normal appearing Nontender midline. Tender palpation left lumbar paraspinal musculature. Decreased lumbar motion. Lower extremity strength is intact.  Left hip: Normal-appearing Decreased hip motion  Lab and Radiology Results  EXAM: MRI CERVICAL SPINE WITHOUT CONTRAST   TECHNIQUE: Multiplanar, multisequence MR imaging of the cervical spine was performed. No intravenous contrast was administered.   COMPARISON:  MRI cervical spine dated April 22, 2020.   FINDINGS: Alignment: Unchanged straightening and slight reversal of the normal cervical lordosis.   Vertebrae: No fracture, evidence of discitis, or bone lesion.   Cord: Normal signal and morphology.   Posterior Fossa, vertebral arteries, paraspinal tissues: Negative.   Disc levels:   C2-C3: Negative disc. Mild bilateral facet arthropathy. No stenosis.   C3-C4: Unchanged tiny central disc protrusion unchanged moderate left and mild right facet arthropathy. Unchanged mild left neuroforaminal stenosis. No spinal canal right neuroforaminal stenosis.   C4-C5: Negative disc. Unchanged mild right facet arthropathy. No stenosis.   C5-C6: Slightly increased small broad-based posterior disc protrusion eccentric to the right with slight flattening of the cord. No stenosis.   C6-C7:  Unchanged small central disc protrusion.  No stenosis.   C7-T1: Negative disc. Unchanged mild bilateral arthropathy. No stenosis.   IMPRESSION: 1. Mild degenerative changes of the cervical spine as above, slightly increased at C5-C6 with mild cord flattening. No high-grade stenosis or impingement.  Electronically Signed   By: Titus Dubin M.D.   On: 07/28/2021 16:20  EXAM: LUMBAR SPINE - COMPLETE 4+ VIEW   COMPARISON:  None   FINDINGS: Five lumbar type vertebral bodies show normal alignment. No disc space  narrowing. Mild lower lumbar facet osteoarthritis, particularly notable at L5-S1. No fracture or focal lesion. Sacroiliac joints appear within normal limits.   IMPRESSION: Mild lower lumbar facet osteoarthritis. No disc space narrowing. No fracture or focal lesion.     Electronically Signed   By: Nelson Chimes M.D.   On: 01/27/2021 13:58 I, Sandra Blair, personally (independently) visualized and performed the interpretation of the images attached in this note.   Impression and Recommendations:    Assessment and Plan: 56 y.o. female with  Right cervical radiculopathy.  This may occur with carpal tunnel syndrome but the dominant source of symptoms is cervical radiculopathy.  Plan for repeat epidural steroid injection. Additionally recommend carpal tunnel wrist brace at night.  She also has chronic left low back pain.  This has been ongoing for over a year.  She is interested in procedures for this.  Most likely targets would be facet joints more focused on the left low facets.  Plan for MRI lumbar spine for facet injection planning.  Consider retrial of physical therapy.  She has been diligent about doing home exercise program taught by her previous physical therapy exposure and by her last spine physician but retrial of PT may be helpful.  PDMP not reviewed this encounter. Orders Placed This Encounter  Procedures   DG INJECT DIAG/THERA/INC NEEDLE/CATH/PLC EPI/LUMB/SAC W/IMG    Standing Status:   Future    Standing Expiration Date:   08/16/2023    Order Specific Question:   Reason for Exam (SYMPTOM  OR DIAGNOSIS REQUIRED)    Answer:   Rt cervical ESI. Level and technique per radiology    Order Specific Question:   Is the patient pregnant?    Answer:   No    Order Specific Question:   Preferred Imaging Location?    Answer:   [facility]    Order Specific Question:   Radiology Contrast Protocol - do NOT remove file path    Answer:    [email or web address]   MR Lumbar Spine Wo Contrast    Standing Status:   Future    Standing Expiration Date:   08/16/2023    Order Specific Question:   What is the patient's sedation requirement?    Answer:   No Sedation    Order Specific Question:   Does the patient have a pacemaker or implanted devices?    Answer:   No    Order Specific Question:   Preferred imaging location?    Answer:   [facility] (table limit-550lbs)   No orders of the defined types were placed in this encounter.   Discussed warning signs or symptoms. Please see discharge instructions. Patient expresses understanding.   The above documentation has been reviewed and is accurate and complete Sandra Blair, M.D.

## 2022-08-18 ENCOUNTER — Ambulatory Visit
Admission: RE | Admit: 2022-08-18 | Discharge: 2022-08-18 | Disposition: A | Discharge status: Home / Self Care | Payer: Managed Care, Other (non HMO) | Source: Ambulatory Visit | Attending: Family Medicine | Admitting: Family Medicine

## 2022-08-18 DIAGNOSIS — M545 Low back pain, unspecified: Secondary | ICD-10-CM

## 2022-08-19 ENCOUNTER — Encounter: Payer: Self-pay | Admitting: Family Medicine

## 2022-08-19 NOTE — Progress Notes (Signed)
Lumbar spine MRI does show some facet arthritis bilaterally at L3-4 and L4-5.  Would you like me to proceed with the facet injections like we discussed?  I do think that physical therapy would be a good idea here as the arthritis is rated as mild.

## 2022-08-23 ENCOUNTER — Other Ambulatory Visit: Payer: Self-pay | Admitting: Family Medicine

## 2022-08-23 ENCOUNTER — Ambulatory Visit
Admission: RE | Admit: 2022-08-23 | Discharge: 2022-08-23 | Disposition: A | Discharge status: Home / Self Care | Payer: Managed Care, Other (non HMO) | Source: Ambulatory Visit | Attending: Family Medicine | Admitting: Family Medicine

## 2022-08-23 ENCOUNTER — Other Ambulatory Visit: Payer: Self-pay

## 2022-08-23 DIAGNOSIS — G8929 Other chronic pain: Secondary | ICD-10-CM

## 2022-08-23 DIAGNOSIS — M5412 Radiculopathy, cervical region: Secondary | ICD-10-CM

## 2022-08-23 MED ORDER — IOPAMIDOL (ISOVUE-M 300) INJECTION 61%
1.0000 mL | Freq: Once | INTRAMUSCULAR | Status: AC | PRN
  Administered 2022-08-23: 1 mL via EPIDURAL

## 2022-08-23 MED ORDER — TRIAMCINOLONE ACETONIDE 40 MG/ML IJ SUSP (RADIOLOGY)
60.0000 mg | Freq: Once | INTRAMUSCULAR | Status: AC
  Administered 2022-08-23: 60 mg via EPIDURAL

## 2022-08-31 ENCOUNTER — Other Ambulatory Visit: Payer: Managed Care, Other (non HMO)

## 2022-10-26 ENCOUNTER — Encounter: Payer: Self-pay | Admitting: Family Medicine

## 2022-10-26 DIAGNOSIS — M5412 Radiculopathy, cervical region: Secondary | ICD-10-CM

## 2022-11-01 ENCOUNTER — Other Ambulatory Visit: Payer: Self-pay | Admitting: Family Medicine

## 2022-11-01 ENCOUNTER — Ambulatory Visit
Admission: RE | Admit: 2022-11-01 | Discharge: 2022-11-01 | Disposition: A | Discharge status: Home / Self Care | Payer: Managed Care, Other (non HMO) | Source: Ambulatory Visit | Attending: Family Medicine | Admitting: Family Medicine

## 2022-11-01 DIAGNOSIS — M5412 Radiculopathy, cervical region: Secondary | ICD-10-CM

## 2022-11-01 MED ORDER — TRIAMCINOLONE ACETONIDE 40 MG/ML IJ SUSP (RADIOLOGY)
60.0000 mg | Freq: Once | INTRAMUSCULAR | Status: AC
  Administered 2022-11-01: 60 mg via EPIDURAL

## 2022-11-01 MED ORDER — IOPAMIDOL (ISOVUE-M 300) INJECTION 61%
1.0000 mL | Freq: Once | INTRAMUSCULAR | Status: AC
  Administered 2022-11-01: 1 mL via EPIDURAL

## 2022-11-01 NOTE — Discharge Instructions (Signed)

## 2022-11-03 ENCOUNTER — Other Ambulatory Visit: Payer: Self-pay | Admitting: Family Medicine

## 2022-11-03 DIAGNOSIS — Z1231 Encounter for screening mammogram for malignant neoplasm of breast: Secondary | ICD-10-CM

## 2022-11-04 ENCOUNTER — Inpatient Hospital Stay: Admission: RE | Admit: 2022-11-04 | Payer: Managed Care, Other (non HMO) | Source: Ambulatory Visit

## 2022-11-18 ENCOUNTER — Ambulatory Visit
Admission: RE | Admit: 2022-11-18 | Discharge: 2022-11-18 | Disposition: A | Discharge status: Home / Self Care | Payer: Managed Care, Other (non HMO) | Source: Ambulatory Visit | Attending: Family Medicine | Admitting: Family Medicine

## 2022-11-18 DIAGNOSIS — Z1231 Encounter for screening mammogram for malignant neoplasm of breast: Secondary | ICD-10-CM

## 2022-11-24 NOTE — Progress Notes (Signed)
I, [name], LAT, ATC acting as a scribe for Lynne Leader, MD.  Sandra Blair is a 57 y.o. female who presents to [place] at [facility] today for R shoulder pain.  Of note, patient is leaving for vacation on February 7. Patient was last seen by Dr. Georgina Snell on 08/15/2022 for cervical radiculitis and back pain w/ her last cervical ESI performed on 11/01/22. Today, pt c/o R shoulder pain. Pt locates pain to the proximal portion of her R upper arm and a spot on the medial-superior border of the R scapula. Pt c/o increased pain at night.  Radiates: yes UE Numbness/tingling: yes- R fingers 3-4, sometimes thumb UE Weakness: yes- w/ certain motions Aggravates: at night Treatments tried: PT, HEP, stretches, massage  Dx imaging: 07/28/21 C-spine MRI  Pertinent review of systems: No fevers or chills  Relevant historical information: Hip arthritis   Exam:  BP 114/76   Pulse 62   Ht 5' 10.5" (1.791 m)   Wt 180 lb (81.6 kg)   SpO2 98%   BMI 25.46 kg/m  General: Well Developed, well nourished, and in no acute distress.   MSK: C-spine: Normal appearing Nontender to palpation cervical midline. Decree cervical motion. Upper extremity strength is intact. Reflexes are intact.  Right shoulder: Normal-appearing Nontender to palpation. Normal shoulder motion some pain with abduction. Intact strength. Minimally positive Hawkins and Neer's test. Minimally positive empty can test. Negative Yergason's and speeds test.    Lab and Radiology Results  Procedure: Real-time Ultrasound Guided Injection of right shoulder subacromial bursa Device: Philips Affiniti 50G Images permanently stored and available for review in PACS Ultrasound evaluation prior to injection reveals mild subacromial bursitis Verbal informed consent obtained.  Discussed risks and benefits of procedure. Warned about infection, bleeding, hyperglycemia damage to structures among others. Patient  expresses understanding and agreement Time-out conducted.   Noted no overlying erythema, induration, or other signs of local infection.   Skin prepped in a sterile fashion.   Local anesthesia: Topical Ethyl chloride.   With sterile technique and under real time ultrasound guidance: 40 mg of Kenalog and 2 mL of Marcaine injected into subacromial bursa. Fluid seen entering the bursa.   Completed without difficulty   Pain moderately  resolved suggesting accurate placement of the medication.   Advised to call if fevers/chills, erythema, induration, drainage, or persistent bleeding.   Images permanently stored and available for review in the ultrasound unit.  Impression: Technically successful ultrasound guided injection.         Assessment and Plan: 57 y.o. female with multifactorial right shoulder pain and arm pain.  The dominant source of her pain I do believe is cervical radiculopathy.  This has been treated multiple times with epidural steroid injections with pretty good results.  Her most recent injection was less than a month ago and worked pretty well.  She has some continued upper arm pain that seems somewhat consistent with shoulder impingement.  On ultrasound she does have findings consistent with bursitis which we will try treating with a subacromial injection.  If not improved consider repeat epidural steroid injection in late February if needed.  She is going on a cruise in February so I have prescribed a backup course of prednisone to take if needed.  I do not expect her to actually take it but it is nice to have if needed.   PDMP not reviewed this encounter. Orders Placed This Encounter  Procedures   [place] LIMITED JOINT SPACE STRUCTURES  UP RIGHT(NO LINKED CHARGES)    Order Specific Question:   Reason for Exam (SYMPTOM  OR DIAGNOSIS REQUIRED)    Answer:   right shoulder pain    Order Specific Question:   Preferred imaging location?    Answer:   [facility]   Meds ordered this encounter  Medications   predniSONE (DELTASONE) 50 MG tablet    Sig: Take 1 tablet (50 mg total) by mouth daily.    Dispense:  5 tablet    Refill:  0     Discussed warning signs or symptoms. Please see discharge instructions. Patient expresses understanding.   The above documentation has been reviewed and is accurate and complete Lynne Leader, M.D.

## 2022-11-25 ENCOUNTER — Ambulatory Visit: Payer: Managed Care, Other (non HMO) | Admitting: Family Medicine

## 2022-11-25 ENCOUNTER — Ambulatory Visit: Payer: Self-pay

## 2022-11-25 VITALS — BP 114/76 | HR 62 | Ht 70.5 in | Wt 180.0 lb

## 2022-11-25 DIAGNOSIS — M25511 Pain in right shoulder: Secondary | ICD-10-CM

## 2022-11-25 DIAGNOSIS — G8929 Other chronic pain: Secondary | ICD-10-CM

## 2022-11-25 DIAGNOSIS — M5412 Radiculopathy, cervical region: Secondary | ICD-10-CM

## 2022-11-25 MED ORDER — PREDNISONE 50 MG PO TABS: 50.0000 mg | ORAL_TABLET | Freq: Every day | ORAL | 0 refills | Status: DC

## 2022-11-25 NOTE — Patient Instructions (Signed)
Thank you for coming in today.   Continue exercise.   Let me know how this goes.   Enjoy your trip.   Take the prednisone if you get into trouble. Let me know if you start taking it.

## 2022-12-22 ENCOUNTER — Encounter: Payer: Self-pay | Admitting: Family Medicine

## 2022-12-23 ENCOUNTER — Telehealth: Payer: Self-pay

## 2022-12-23 MED ORDER — DICLOFENAC SODIUM 75 MG PO TBEC: 75.0000 mg | DELAYED_RELEASE_TABLET | Freq: Two times a day (BID) | ORAL | 2 refills | Status: DC | PRN

## 2022-12-23 NOTE — Telephone Encounter (Signed)
Pharmacy called to see if the diclofenac rx was entered in error at [facility], which is over the allowable daily dosage. Pharmacy advised, this was most likely just an error on our end and would correct the rx to bid.

## 2022-12-23 NOTE — Addendum Note (Signed)
Addended by: Gregor Hams on: 12/23/2022 06:40 AM   Modules accepted: Orders

## 2022-12-26 ENCOUNTER — Ambulatory Visit: Payer: Managed Care, Other (non HMO)

## 2023-02-02 ENCOUNTER — Encounter: Payer: Self-pay | Admitting: Family Medicine

## 2023-02-02 DIAGNOSIS — G8929 Other chronic pain: Secondary | ICD-10-CM

## 2023-02-02 DIAGNOSIS — M47816 Spondylosis without myelopathy or radiculopathy, lumbar region: Secondary | ICD-10-CM

## 2023-02-02 DIAGNOSIS — M5412 Radiculopathy, cervical region: Secondary | ICD-10-CM

## 2023-02-06 NOTE — Telephone Encounter (Signed)
Dr. Denyse Amass, please confirm order/level for [facility]

## 2023-02-23 ENCOUNTER — Ambulatory Visit
Admission: RE | Admit: 2023-02-23 | Discharge: 2023-02-23 | Disposition: A | Discharge status: Home / Self Care | Payer: Managed Care, Other (non HMO) | Source: Ambulatory Visit | Attending: Family Medicine | Admitting: Family Medicine

## 2023-02-23 DIAGNOSIS — M5412 Radiculopathy, cervical region: Secondary | ICD-10-CM

## 2023-02-23 MED ORDER — TRIAMCINOLONE ACETONIDE 40 MG/ML IJ SUSP (RADIOLOGY)
60.0000 mg | Freq: Once | INTRAMUSCULAR | Status: AC
  Administered 2023-02-23: 60 mg via EPIDURAL

## 2023-02-23 MED ORDER — IOPAMIDOL (ISOVUE-M 300) INJECTION 61%
1.0000 mL | Freq: Once | INTRAMUSCULAR | Status: AC | PRN
  Administered 2023-02-23: 1 mL via EPIDURAL

## 2023-02-23 NOTE — Discharge Instructions (Signed)
Post Procedure Spinal Discharge Instruction Sheet .  . You may resume a regular diet and any medications that you routinely take (including pain medications). .  . No driving day of procedure. .  . Light activity throughout the rest of the day.  Do not do any strenuous work, exercise, bending or lifting.  The day following the procedure, you can resume normal physical activity but you should refrain from exercising or physical therapy for at least three days thereafter. .  .  . Common Side Effects: .  . Headaches- take your usual medications as directed by your physician.  Increase your fluid intake.  Caffeinated beverages may be helpful.  Lie flat in bed until your headache resolves. .  . Restlessness or inability to sleep- you may have trouble sleeping for the next few days.  Ask your referring physician if you need any medication for sleep. .  . Facial flushing or redness- should subside within a few days. .  . Increased pain- a temporary increase in pain a day or two following your procedure is not unusual.  Take your pain medication as prescribed by your referring physician. .  . Leg cramps .  . Please contact our office at [phone] for the following symptoms: . Fever greater than 100 degrees. . Headaches unresolved with medication after 2-3 days.  Increased swelling, pain, or redness at injection site. 

## 2023-03-10 ENCOUNTER — Other Ambulatory Visit: Payer: Managed Care, Other (non HMO)

## 2023-03-10 ENCOUNTER — Ambulatory Visit
Admission: RE | Admit: 2023-03-10 | Discharge: 2023-03-10 | Disposition: A | Discharge status: Home / Self Care | Payer: Managed Care, Other (non HMO) | Source: Ambulatory Visit | Attending: Family Medicine | Admitting: Family Medicine

## 2023-03-10 DIAGNOSIS — M47816 Spondylosis without myelopathy or radiculopathy, lumbar region: Secondary | ICD-10-CM

## 2023-03-10 DIAGNOSIS — G8929 Other chronic pain: Secondary | ICD-10-CM

## 2023-03-10 MED ORDER — METHYLPREDNISOLONE ACETATE 40 MG/ML INJ SUSP (RADIOLOG
80.0000 mg | Freq: Once | INTRAMUSCULAR | Status: AC
  Administered 2023-03-10: 80 mg via INTRA_ARTICULAR

## 2023-03-10 MED ORDER — IOPAMIDOL (ISOVUE-M 200) INJECTION 41%
1.0000 mL | Freq: Once | INTRAMUSCULAR | Status: AC
  Administered 2023-03-10: 1 mL via INTRA_ARTICULAR

## 2023-03-22 ENCOUNTER — Telehealth: Payer: Self-pay | Admitting: Family Medicine

## 2023-03-22 ENCOUNTER — Ambulatory Visit
Admission: RE | Admit: 2023-03-22 | Discharge: 2023-03-22 | Disposition: A | Discharge status: Home / Self Care | Payer: Managed Care, Other (non HMO) | Source: Ambulatory Visit | Attending: Family Medicine | Admitting: Family Medicine

## 2023-03-22 ENCOUNTER — Other Ambulatory Visit: Payer: Self-pay

## 2023-03-22 DIAGNOSIS — M25572 Pain in left ankle and joints of left foot: Secondary | ICD-10-CM

## 2023-03-22 DIAGNOSIS — M25561 Pain in right knee: Secondary | ICD-10-CM

## 2023-03-22 NOTE — Telephone Encounter (Signed)
Patient stating that she fell over the weekend while at the beach and rolled her left ankle and fell hard on her right knee. She works for [employer] and is able to get [place] for free there.  She asked if Dr Denyse Amass could order an Xray for her left ankle and right knee?  Please advise.

## 2023-03-22 NOTE — Telephone Encounter (Signed)
Patient informed. 

## 2023-03-23 ENCOUNTER — Ambulatory Visit: Payer: Managed Care, Other (non HMO) | Admitting: Family Medicine

## 2023-03-23 VITALS — BP 114/76 | HR 62 | Ht 70.5 in | Wt 180.0 lb

## 2023-03-23 DIAGNOSIS — S82892A Other fracture of left lower leg, initial encounter for closed fracture: Secondary | ICD-10-CM

## 2023-03-23 NOTE — Progress Notes (Signed)
Left ankle x-ray shows a tiny avulsion fracture.  This will behave like an ankle sprain.  We should use an ankle brace or cam walker boot.  I do think it makes sense for you to schedule an appointment with me to treat your ankle more effectively.

## 2023-03-23 NOTE — Patient Instructions (Signed)
Thank you for coming in today.   Use the boot as needed.   Continue ankle brace.   Use eleastic band.   Ankle Sprain, Phase I Rehab An ankle sprain is an injury to the tissues that connect bone to bone (ligaments) in your ankle. Ankle sprains can cause stiffness, loss of motion, and loss of strength. Ask your health care provider which exercises are safe for you. Do exercises exactly as told by your provider and adjust them as directed. It is normal to feel mild stretching, pulling, tightness, or discomfort as you do these exercises. Stop right away if you feel sudden pain or your pain gets worse. Do not begin these exercises until told by your provider. Stretching and range-of-motion exercises These exercises warm up your muscles and joints. They can improve the movement and flexibility of your lower leg and ankle. They also help to relieve pain and stiffness. Gastroc and soleus stretch This exercise is also called a calf stretch. It stretches the muscles in the back of the lower leg. These muscles are the gastrocnemius, or gastroc, and the soleus. Sit on the floor with your left / right leg extended. Loop a belt or towel around the ball of your left / right foot. The ball of your foot is on the walking surface, right under your toes. Keep your left / right ankle and foot relaxed and keep your knee straight. Use the belt or towel to pull your foot toward you. You should feel a gentle stretch behind your calf or knee in your gastroc muscle. Hold this position for __________ seconds, then release to the starting position. Repeat the exercise with your knee bent. You can put a pillow or a rolled bath towel under your knee to support it. You should feel a stretch deep in your calf in the soleus muscle or at your Achilles tendon. Repeat __________ times. Complete this exercise __________ times a day. Ankle alphabet  Sit with your left / right leg supported at the lower leg. Do not rest your foot  on anything. Make sure your foot has room to move freely. Think of your left / right foot as a paintbrush. Move your foot to trace each letter of the alphabet in the air. Keep your hip and knee still while you trace. Make the letters as large as you can without feeling discomfort. Trace every letter from A to Z. Repeat __________ times. Complete this exercise __________ times a day. Strengthening exercises These exercises build strength and endurance in your ankle and lower leg. Endurance is the ability to use your muscles for a long time, even after they get tired. Ankle dorsiflexion  Secure a rubber exercise band or tube to an object, such as a table leg, that will stay still when the band is pulled. Secure the other end around your left / right foot. Sit on the floor facing the object, with your left / right leg extended. The band or tube should be slightly tense when your foot is relaxed. Slowly bring your foot toward you, bringing the top of your foot toward your shin (dorsiflexion), and pulling the band tighter. Hold this position for __________ seconds. Slowly return your foot to the starting position. Repeat __________ times. Complete this exercise __________ times a day. Ankle plantar flexion  Sit on the floor with your left / right leg extended. Loop a rubber exercise tube or band around the ball of your left / right foot. The ball of your foot is  on the walking surface, right under your toes. Hold the ends of the band or tube in your hands. The band or tube should be slightly tense when your foot is relaxed. Slowly point your foot and toes downward to tilt the top of your foot away from your shin (plantar flexion). Hold this position for __________ seconds. Slowly return your foot to the starting position. Repeat __________ times. Complete this exercise __________ times a day. Ankle eversion  Sit on the floor with your legs straight out in front of you. Loop a rubber exercise  band or tube around the ball of your left / right foot. The ball of your foot is on the walking surface, right under your toes. Hold the ends of the band in your hands or secure the band to a stable object. The band or tube should be slightly tense when your foot is relaxed. Slowly push your foot outward, away from your other leg (eversion). Hold this position for __________ seconds. Slowly return your foot to the starting position. Repeat __________ times. Complete this exercise __________ times a day. This information is not intended to replace advice given to you by your health care provider. Make sure you discuss any questions you have with your health care provider. Document Revised: 08/03/2022 Document Reviewed: 08/03/2022 Elsevier Patient Education  2024 [employer].

## 2023-03-23 NOTE — Progress Notes (Signed)
Right knee x-ray looks normal to radiology

## 2023-03-23 NOTE — Progress Notes (Signed)
Rubin Payor, PhD, LAT, ATC acting as a scribe for Clementeen Graham, MD.  [place] Skeeter Zilles is a 57 y.o. female who presents to [facility] at [facility] today for f/u  L ankle avulsion fx. Pt was previously seen by Dr. Denyse Amass on 11/25/22 for R shoulder pain and cervical radiculitis.   Pt called the office on 5/29 reporting a fall over the weekend while at the beach "rolling" her L ankle and landing on her R knee. She requested XR at [facility] where she is employed. Ankle XR revealed avulsion fx. Today, pt reports she has been wearing an ankle brace. She is concerned about wearing the boot w/ her hx of back and hip issues, which are currently stable.   Dx imaging: 03/22/23 L ankle & R knee XR  Pertinent review of systems: No fevers or chills  Relevant historical information: Chronic back pain   Exam:  BP 114/76   Pulse 62   Ht 5' 10.5" (1.791 m)   Wt 180 lb (81.6 kg)   SpO2 98%   BMI 25.46 kg/m  General: Well Developed, well nourished, and in no acute distress.   MSK: Left ankle bruising present left lateral foot and ankle. Tender palpation ATFL region and proximal fifth metatarsal.  Normal foot and ankle motion.  Intact strength.  Mild laxity to talar tilt and anterior drawer test. Pulses capillary fill and sensation are intact distally.    Lab and Radiology Results No results found for this or any previous visit (from the past 72 hour(s)). DG Knee AP/LAT W/Sunrise Right  Result Date: 03/22/2023 CLINICAL DATA:  Fall.  Right knee pain. EXAM: RIGHT KNEE 3 VIEWS COMPARISON:  None Available. FINDINGS: No acute fracture or dislocation. No joint effusion. Mild medial and patellofemoral compartment joint space narrowing. Bone mineralization is normal. Soft tissues are unremarkable. IMPRESSION: 1. No acute osseous abnormality. Electronically Signed   By: Obie Dredge M.D.   On: 03/22/2023 15:04   DG Ankle Complete Left  Result Date: 03/22/2023 CLINICAL DATA:   Twisting injury with lateral pain and swelling EXAM: LEFT ANKLE COMPLETE - 3+ VIEW COMPARISON:  None Available. FINDINGS: Tiny avulsion fracture from the lateral talus secondary to talofibular ligament injury. Tiny avulsion fracture at the base of the fifth metatarsal. No large or displaced fracture. The ankle joint itself appears intact and normal. Small plantar calcaneal spur incidentally noted. IMPRESSION: 1. Tiny avulsion fracture from the lateral talus consistent with talofibular ligament avulsion fracture. 2. Tiny avulsion fracture at the base of the fifth metatarsal. Electronically Signed   By: Paulina Fusi M.D.   On: 03/22/2023 15:00    I, Clementeen Graham, personally (independently) visualized and performed the interpretation of the images attached in this note.    Assessment and Plan: 57 y.o. female with left ankle pain due to tiny avulsion fracture at anterior talus and at proximal fifth metatarsal.  This will behave like an ankle sprain.  Plan for ASO ankle brace and home exercise program taught in clinic today.  She can use a cam walker boot which she already has if needed.  Additionally we can refer to PT if needed.  She has an established relationship with [facility].  She will keep me updated and let me know how she feels.   PDMP not reviewed this encounter. No orders of the defined types were placed in this encounter.  No orders of the defined types were placed in this encounter.    Discussed  warning signs or symptoms. Please see discharge instructions. Patient expresses understanding.   The above documentation has been reviewed and is accurate and complete Clementeen Graham, M.D.

## 2023-04-18 ENCOUNTER — Encounter: Payer: Self-pay | Admitting: Family Medicine

## 2023-04-18 ENCOUNTER — Ambulatory Visit: Payer: Managed Care, Other (non HMO) | Admitting: Family Medicine

## 2023-04-18 VITALS — BP 102/72 | HR 57 | Ht 70.5 in | Wt 185.6 lb

## 2023-04-18 DIAGNOSIS — M542 Cervicalgia: Secondary | ICD-10-CM

## 2023-04-18 MED ORDER — TIZANIDINE HCL 2 MG PO TABS: 2.0000 mg | ORAL_TABLET | Freq: Three times a day (TID) | ORAL | 1 refills | Status: DC | PRN

## 2023-04-18 MED ORDER — GABAPENTIN 300 MG PO CAPS: 300.0000 mg | ORAL_CAPSULE | Freq: Every day | ORAL | 3 refills | Status: DC

## 2023-04-18 MED ORDER — PREDNISONE 50 MG PO TABS
50.0000 mg | ORAL_TABLET | Freq: Every day | ORAL | 0 refills | Status: DC
Start: 2023-04-18 — End: 2023-11-13

## 2023-04-18 NOTE — Progress Notes (Unsigned)
I, Stevenson Clinch, CMA acting as a scribe for Clementeen Graham, MD.  Sandra Blair is a 57 y.o. female who presents to [facility] at [facility] today for re-occurring neck and back pain. Her last visit for these problems w/ Dr. Denyse Amass was on 11/25/22. Pt sent a MyChart message on April 11th and repeat cervical ESI and 4 lumbar facet injections were ordered and later performed on May 2nd and May 17th. Only the L-sided lumbar facet injections were performed.  Today, pt reports some improvement, short-term, of back with s/p ESI. Notes worsening neck pain since getting new pillow. Notes limited ROM when turning to the right, pulling sensation. Sx seem more muscular in nature, like inflammation. Sx present x 2-3 weeks. Has also tried 800 mg IBU. Has been doing HEP provided by PT. Denies radiuclar sx. RHD. Needs refill on Gabapentin.  Radiating pain: not currently LE numbness/tingling: not currently LE weakness: no Aggravates: looking to the right Treatments tried: IBU  Dx imaging: 08/18/22 L-spine MRI  02/03/22 T-spine XR 07/28/21 C-spine MRI   Pertinent review of systems: No fevers or chills  Relevant historical information: Works at [employer]   Exam:  BP 102/72   Pulse (!) 57   Ht 5' 10.5" (1.791 m)   Wt 185 lb 9.6 oz (84.2 kg)   SpO2 96%   BMI 26.25 kg/m  General: Well Developed, well nourished, and in no acute distress.   MSK: C-spine: Normal appearing. Tender palpation right lateral cervical spine muscle groups. Decree cervical motion. Upper extremity strength is intact.    Lab and Radiology Results   I, Clementeen Graham, personally (independently) visualized and performed the interpretation of the images attached in this note.     Assessment and Plan: 57 y.o. female with neck pain due to muscle spasm and dysfunction.  Symptoms ongoing for about a month which is a bit long for muscle spasm.  Plan for physical therapy heating pad and TENS unit.   Prescribed tizanidine.  I have refilled the gabapentin which she can use if needed.  Additionally prescribed backup course of prednisone to use if this gets worse.  PT should be the main treatment option.  Consider repeat cervical spine x-ray as needed in the future if not better.  It is cheaper to get x-rays at work that it is my office.   PDMP not reviewed this encounter. Orders Placed This Encounter  Procedures   Ambulatory referral to Physical Therapy    Referral Priority:   Routine    Referral Type:   Physical Medicine    Referral Reason:   Specialty Services Required    Requested Specialty:   Physical Therapy    Number of Visits Requested:   1   Meds ordered this encounter  Medications   gabapentin (NEURONTIN) 300 MG capsule    Sig: Take 1 capsule (300 mg total) by mouth at bedtime.    Dispense:  90 capsule    Refill:  3   predniSONE (DELTASONE) 50 MG tablet    Sig: Take 1 tablet (50 mg total) by mouth daily.    Dispense:  5 tablet    Refill:  0   tiZANidine (ZANAFLEX) 2 MG tablet    Sig: Take 1-2 tablets (2-4 mg total) by mouth every 8 (eight) hours as needed for muscle spasms.    Dispense:  60 tablet    Refill:  1     Discussed warning signs or symptoms. Please see discharge instructions.  Patient expresses understanding.   The above documentation has been reviewed and is accurate and complete Lynne Leader, M.D.

## 2023-04-18 NOTE — Patient Instructions (Addendum)
Thank you for coming in today.   Heating pad and TENS unit.   You do have tizanidine muscle relaxer you can use at bedtime.   You have the emergency backup prednisone you can take.   I've referred you to Physical Therapy.  Let us know if you don't hear from them in one week.   Let me know if you want an xray or the facet never block and ablation.   TENS UNIT: This is helpful for muscle pain and spasm.   Search and Purchase a TENS 7000 2nd edition at  www.tenspros.com or www.Amazon.com It should be less than $30.     TENS unit instructions: Do not shower or bathe with the unit on Turn the unit off before removing electrodes or batteries If the electrodes lose stickiness add a drop of water to the electrodes after they are disconnected from the unit and place on plastic sheet. If you continued to have difficulty, call the TENS unit company to purchase more electrodes. Do not apply lotion on the skin area prior to use. Make sure the skin is clean and dry as this will help prolong the life of the electrodes. After use, always check skin for unusual red areas, rash or other skin difficulties. If there are any skin problems, does not apply electrodes to the same area. Never remove the electrodes from the unit by pulling the wires. Do not use the TENS unit or electrodes other than as directed. Do not change electrode placement without consultating your therapist or physician. Keep 2 fingers with between each electrode. Wear time ratio is 2:1, on to off times.    For example on for 30 minutes off for 15 minutes and then on for 30 minutes off for 15 minutes

## 2023-05-13 ENCOUNTER — Encounter: Payer: Self-pay | Admitting: Family Medicine

## 2023-05-13 DIAGNOSIS — M542 Cervicalgia: Secondary | ICD-10-CM

## 2023-05-13 DIAGNOSIS — M5412 Radiculopathy, cervical region: Secondary | ICD-10-CM

## 2023-05-15 MED ORDER — DICLOFENAC SODIUM 75 MG PO TBEC: 75.0000 mg | DELAYED_RELEASE_TABLET | Freq: Two times a day (BID) | ORAL | 2 refills | Status: DC | PRN

## 2023-05-16 ENCOUNTER — Encounter: Payer: Self-pay | Admitting: Family Medicine

## 2023-05-17 ENCOUNTER — Ambulatory Visit
Admission: RE | Admit: 2023-05-17 | Discharge: 2023-05-17 | Disposition: A | Discharge status: Home / Self Care | Payer: Managed Care, Other (non HMO) | Source: Ambulatory Visit | Attending: Family Medicine | Admitting: Family Medicine

## 2023-05-17 DIAGNOSIS — M542 Cervicalgia: Secondary | ICD-10-CM

## 2023-05-17 DIAGNOSIS — M5412 Radiculopathy, cervical region: Secondary | ICD-10-CM

## 2023-05-17 MED ORDER — IOPAMIDOL (ISOVUE-M 300) INJECTION 61%
1.0000 mL | Freq: Once | INTRAMUSCULAR | Status: AC | PRN
  Administered 2023-05-17: 1 mL via EPIDURAL

## 2023-05-17 MED ORDER — TRIAMCINOLONE ACETONIDE 40 MG/ML IJ SUSP (RADIOLOGY)
60.0000 mg | Freq: Once | INTRAMUSCULAR | Status: AC
  Administered 2023-05-17: 60 mg via EPIDURAL

## 2023-05-18 ENCOUNTER — Inpatient Hospital Stay: Admission: RE | Admit: 2023-05-18 | Payer: Managed Care, Other (non HMO) | Source: Ambulatory Visit

## 2023-07-27 ENCOUNTER — Other Ambulatory Visit: Payer: Self-pay | Admitting: Family Medicine

## 2023-07-28 NOTE — Telephone Encounter (Signed)
Last OV 04/18/23 Next OV not scheduled  Last refill 05/15/23 Qty #60/2    Comprehensive Metabolic Panel Order: [id number] Component Ref Range & Units 2 mo ago  Sodium 136 - 145 mmol/L 140  Potassium 3.5 - 5.1 mmol/L 4.4  Comment: NO VISIBLE HEMOLYSIS  Chloride 98 - 107 mmol/L 104  CO2 21 - 31 mmol/L 29  Anion Gap 6 - 14 mmol/L 7  Glucose, Random 70 - 99 mg/dL 86  Blood Urea Nitrogen (BUN) 7 - 25 mg/dL 11  Creatinine 4.09 - 8.11 mg/dL 9.14  eGFR >78 GN/FAO/1.30Q6 >90  Comment: GFR estimated by CKD-EPI equations(NKF 2021).  "Recommend confirmation of Cr-based eGFR by using Cys-based eGFR and other filtration markers (if applicable) in complex cases and clinical decision-making, as needed."  Albumin 3.5 - 5.7 g/dL 4.4  Total Protein 6.4 - 8.9 g/dL 6.5  Bilirubin, Total 0.3 - 1.0 mg/dL 0.5  Alkaline Phosphatase (ALP) 34 - 104 U/L 61  Aspartate Aminotransferase (AST) 13 - 39 U/L 19  Alanine Aminotransferase (ALT) 7 - 52 U/L 12  Calcium 8.6 - 10.3 mg/dL 9.2  BUN/Creatinine Ratio   Comment: Creatinine is normal, ratio is not clinically indicated.  Resulting Agency [facility] [place] [facility](CLIA# [id number])

## 2023-08-01 ENCOUNTER — Other Ambulatory Visit: Payer: Self-pay | Admitting: Physician Assistant

## 2023-08-01 DIAGNOSIS — G44209 Tension-type headache, unspecified, not intractable: Secondary | ICD-10-CM

## 2023-08-02 ENCOUNTER — Ambulatory Visit
Admission: RE | Admit: 2023-08-02 | Discharge: 2023-08-02 | Disposition: A | Discharge status: Home / Self Care | Payer: Managed Care, Other (non HMO) | Source: Ambulatory Visit | Attending: Physician Assistant | Admitting: Physician Assistant

## 2023-08-02 DIAGNOSIS — G44209 Tension-type headache, unspecified, not intractable: Secondary | ICD-10-CM

## 2023-08-10 ENCOUNTER — Encounter: Payer: Self-pay | Admitting: Family Medicine

## 2023-08-10 DIAGNOSIS — M47816 Spondylosis without myelopathy or radiculopathy, lumbar region: Secondary | ICD-10-CM

## 2023-08-15 ENCOUNTER — Ambulatory Visit
Admission: RE | Admit: 2023-08-15 | Discharge: 2023-08-15 | Disposition: A | Discharge status: Home / Self Care | Payer: Managed Care, Other (non HMO) | Source: Ambulatory Visit | Attending: Family Medicine | Admitting: Family Medicine

## 2023-08-15 ENCOUNTER — Other Ambulatory Visit: Payer: Self-pay | Admitting: Family Medicine

## 2023-08-15 ENCOUNTER — Encounter: Payer: Self-pay | Admitting: Family Medicine

## 2023-08-15 DIAGNOSIS — M545 Low back pain, unspecified: Secondary | ICD-10-CM

## 2023-08-17 ENCOUNTER — Other Ambulatory Visit: Payer: Self-pay | Admitting: Family Medicine

## 2023-08-17 ENCOUNTER — Encounter: Payer: Self-pay | Admitting: Family Medicine

## 2023-08-17 DIAGNOSIS — M545 Low back pain, unspecified: Secondary | ICD-10-CM

## 2023-08-31 NOTE — Discharge Instructions (Signed)
Radio Frequency Ablation Post Procedure Discharge Instructions ? ?May resume a regular diet and any medications that you routinely take (including pain medications). ?No driving day of procedure. ?Upon discharge go home and rest for at least 4 hours.  May use an ice pack as needed to injection sites on back. ?Remove bandades later, today. ? ? ? ?Please contact our office at [phone] for the following symptoms: ? ?Fever greater than 100 degrees ?Increased swelling, pain, or redness at injection site. ? ? ?Thank you for visiting [facility].  ?

## 2023-09-01 ENCOUNTER — Ambulatory Visit
Admission: RE | Admit: 2023-09-01 | Discharge: 2023-09-01 | Disposition: A | Discharge status: Home / Self Care | Payer: Managed Care, Other (non HMO) | Source: Ambulatory Visit | Attending: Family Medicine | Admitting: Family Medicine

## 2023-09-01 ENCOUNTER — Other Ambulatory Visit: Payer: Self-pay | Admitting: Family Medicine

## 2023-09-01 DIAGNOSIS — M545 Low back pain, unspecified: Secondary | ICD-10-CM

## 2023-09-01 MED ORDER — MIDAZOLAM HCL 2 MG/2ML IJ SOLN
1.0000 mg | INTRAMUSCULAR | Status: DC | PRN
  Administered 2023-09-01 (×5): 1 mg via INTRAVENOUS

## 2023-09-01 MED ORDER — KETOROLAC TROMETHAMINE 30 MG/ML IJ SOLN
30.0000 mg | Freq: Once | INTRAMUSCULAR | Status: AC
  Administered 2023-09-01: 30 mg via INTRAVENOUS

## 2023-09-01 MED ORDER — SODIUM CHLORIDE 0.9 % IV SOLN: INTRAVENOUS | Status: DC

## 2023-09-01 MED ORDER — FENTANYL CITRATE PF 50 MCG/ML IJ SOSY
25.0000 ug | PREFILLED_SYRINGE | INTRAMUSCULAR | Status: DC | PRN
  Administered 2023-09-01 (×6): 25 ug via INTRAVENOUS

## 2023-09-01 MED ORDER — METHYLPREDNISOLONE ACETATE 40 MG/ML INJ SUSP (RADIOLOG
120.0000 mg | Freq: Once | INTRAMUSCULAR | Status: AC
  Administered 2023-09-01: 120 mg via INTRALESIONAL

## 2023-09-01 NOTE — Progress Notes (Signed)
Pt back in nursing recovery area. Pt still drowsy from procedure but will wake up when spoken to. Pt follows commands, talks in complete sentences and has no complaints at this time. Pt will be monitored until discharged by Radiologist.   

## 2023-11-13 ENCOUNTER — Ambulatory Visit: Payer: Managed Care, Other (non HMO) | Admitting: Family Medicine

## 2023-11-13 VITALS — BP 122/74 | HR 54 | Ht 70.5 in | Wt 191.0 lb

## 2023-11-13 DIAGNOSIS — M1612 Unilateral primary osteoarthritis, left hip: Secondary | ICD-10-CM

## 2023-11-13 DIAGNOSIS — M5412 Radiculopathy, cervical region: Secondary | ICD-10-CM | POA: Diagnosis not present

## 2023-11-13 DIAGNOSIS — M5481 Occipital neuralgia: Secondary | ICD-10-CM

## 2023-11-13 DIAGNOSIS — M25552 Pain in left hip: Secondary | ICD-10-CM | POA: Diagnosis not present

## 2023-11-13 DIAGNOSIS — G8929 Other chronic pain: Secondary | ICD-10-CM

## 2023-11-13 NOTE — Progress Notes (Signed)
Rubin Payor, PhD, LAT, ATC acting as a scribe for Clementeen Graham, MD.  Sandra Blair is a 58 y.o. female who presents to [facility] today for cont'd neck pain. Pt was last seen by Dr. Denyse Amass on 04/18/23 and was prescribed tizanidine and referred to PT. Also advised to use a heating pad and TENS unit. Last cervical ESI; 05/17/23.  Today, pt reports neck pain is slightly worse. Numbness/tingling has returned into the R arm. She notes a "[place]'s [place]" or tension HA, along the R-side of her neck and radiates through the frontal region of her head, to her R eye.   She also c/o L hip pain. She locates pain to the L groin and into the posterior hip.  She notes that activities that require hip flexion and external rotation are painful.  Normal daily activities are painful but it is becoming quite painful to have sex as well. She had a left intra-articular hip injection in 2023 which temporarily help but did not provide dramatic lasting relief.   Dx imaging: 08/18/22 L-spine MRI             02/03/22 T-spine XR 07/28/21 C-spine MRI  Pertinent review of systems: No fevers or chills  Relevant historical information: Cervical radiculopathy.   Exam:  BP 122/74   Pulse (!) 54   Ht 5' 10.5" (1.791 m)   Wt 191 lb (86.6 kg)   SpO2 98%   BMI 27.02 kg/m  General: Well Developed, well nourished, and in no acute distress.   MSK: Normal appearing C-spine: Normal appearing Tender palpation posterior cervical paraspinal musculature at base of skull. Decreased cervical motion. Number strength is intact.  Left hip normal-appearing Decreased hip range of motion lacks flexion and rotation.    Lab and Radiology Results  Left hip x-ray ordered to be done at [facility].     Assessment and Plan: 58 y.o. female with right cervical radiculopathy at C7 nerve root.  She has had good benefit in the past from epidural steroid injection.  Plan to repeat ESI.  Patient has  evidence of right occipital neuralgia.  Plan for return to PT to try dry needling.  If that does not work well enough would refer to pain management to evaluate for a occipital nerve block.  Left anterior hip pain due to hip arthritis seen on prior x-ray.  Repeat x-ray ordered today.  Plan for diagnostic and therapeutic left hip injection done at [facility].  If not improving and if worse arthritis seen consider referral to orthopedic surgery to discuss options.  She already likes Dr. Magnus Ivan and Dr Roda Shutters.     PDMP not reviewed this encounter. Orders Placed This Encounter  Procedures   DG HIP UNILAT WITH PELVIS 2-3 VIEWS LEFT    Standing Status:   Future    Expiration Date:   11/12/2024    Reason for Exam (SYMPTOM  OR DIAGNOSIS REQUIRED):   left hip pain    Is patient pregnant?:   No    Preferred imaging location?:   [facility]   DG FLUORO GUIDED NEEDLE PLC ASPIRATION/INJECTION LOC    Standing Status:   Future    Expiration Date:   11/12/2024    Lab orders requested (DO NOT place separate lab orders, these will be automatically ordered during procedure specimen collection)::   None    Reason for Exam (SYMPTOM  OR DIAGNOSIS REQUIRED):   right hip pain    Is the patient  pregnant?:   No    Preferred Imaging Location?:   [facility]   DG INJECT DIAG/THERA/INC NEEDLE/CATH/PLC EPI/CERV/THOR W/IMG    R-side cervical radiculapathy    Standing Status:   Future    Expiration Date:   12/14/2023    Reason for Exam (SYMPTOM  OR DIAGNOSIS REQUIRED):   cervical radiculopathy    Preferred Imaging Location?:   [facility]    Is the patient pregnant?:   No   No orders of the defined types were placed in this encounter.    Discussed warning signs or symptoms. Please see discharge instructions. Patient expresses understanding.   The above documentation has been reviewed and is accurate and complete Clementeen Graham, M.D.

## 2023-11-13 NOTE — Patient Instructions (Addendum)
Thank you for coming in today.   Please call [facility] ([facility]) at [phone] to schedule your spine injection.    I've ordered an injection for your hip.  Please contact [facility] to get scheduled.

## 2023-11-14 ENCOUNTER — Encounter: Payer: Self-pay | Admitting: Family Medicine

## 2023-11-15 ENCOUNTER — Ambulatory Visit
Admission: RE | Admit: 2023-11-15 | Discharge: 2023-11-15 | Disposition: A | Discharge status: Home / Self Care | Payer: Managed Care, Other (non HMO) | Source: Ambulatory Visit | Attending: Family Medicine | Admitting: Family Medicine

## 2023-11-15 DIAGNOSIS — G8929 Other chronic pain: Secondary | ICD-10-CM

## 2023-11-20 ENCOUNTER — Encounter: Payer: Self-pay | Admitting: Family Medicine

## 2023-11-20 NOTE — Discharge Instructions (Signed)

## 2023-11-20 NOTE — Progress Notes (Signed)
Left hip x-ray shows mild arthritis of both hip joints.  The left is worse than the right.

## 2023-11-21 ENCOUNTER — Ambulatory Visit
Admission: RE | Admit: 2023-11-21 | Discharge: 2023-11-21 | Disposition: A | Discharge status: Home / Self Care | Payer: Managed Care, Other (non HMO) | Source: Ambulatory Visit | Attending: Family Medicine | Admitting: Family Medicine

## 2023-11-21 DIAGNOSIS — M5481 Occipital neuralgia: Secondary | ICD-10-CM

## 2023-11-21 DIAGNOSIS — M5412 Radiculopathy, cervical region: Secondary | ICD-10-CM

## 2023-11-21 MED ORDER — IOPAMIDOL (ISOVUE-M 300) INJECTION 61%
1.0000 mL | Freq: Once | INTRAMUSCULAR | Status: AC
  Administered 2023-11-21: 1 mL via EPIDURAL

## 2023-11-21 MED ORDER — TRIAMCINOLONE ACETONIDE 40 MG/ML IJ SUSP (RADIOLOGY)
60.0000 mg | Freq: Once | INTRAMUSCULAR | Status: AC
  Administered 2023-11-21: 60 mg via EPIDURAL

## 2023-11-24 HISTORY — PX: COLONOSCOPY: SHX174

## 2023-12-01 ENCOUNTER — Ambulatory Visit
Admission: RE | Admit: 2023-12-01 | Discharge: 2023-12-01 | Disposition: A | Discharge status: Home / Self Care | Payer: Managed Care, Other (non HMO) | Source: Ambulatory Visit | Attending: Family Medicine | Admitting: Family Medicine

## 2023-12-01 DIAGNOSIS — M1612 Unilateral primary osteoarthritis, left hip: Secondary | ICD-10-CM

## 2023-12-01 DIAGNOSIS — G8929 Other chronic pain: Secondary | ICD-10-CM

## 2023-12-01 MED ORDER — METHYLPREDNISOLONE ACETATE 40 MG/ML INJ SUSP (RADIOLOG
80.0000 mg | Freq: Once | INTRAMUSCULAR | Status: AC
  Administered 2023-12-01: 80 mg via INTRA_ARTICULAR

## 2023-12-01 MED ORDER — IOPAMIDOL (ISOVUE-M 200) INJECTION 41%
1.0000 mL | Freq: Once | INTRAMUSCULAR | Status: AC
  Administered 2023-12-01: 1 mL via INTRA_ARTICULAR

## 2023-12-06 ENCOUNTER — Other Ambulatory Visit: Payer: Managed Care, Other (non HMO)

## 2023-12-20 ENCOUNTER — Encounter: Payer: Self-pay | Admitting: Family Medicine

## 2023-12-20 NOTE — Telephone Encounter (Signed)
 Forwarding to Dr. Denyse Amass to review and advise.

## 2023-12-21 NOTE — Telephone Encounter (Signed)
 Forwarding to Dr. Denyse Amass as Lorain Childes.

## 2023-12-27 ENCOUNTER — Telehealth: Payer: Self-pay | Admitting: Family Medicine

## 2023-12-27 MED ORDER — PREDNISONE 50 MG PO TABS: 50.0000 mg | ORAL_TABLET | Freq: Every day | ORAL | 0 refills | Status: DC

## 2023-12-27 NOTE — Telephone Encounter (Signed)
 Patient is calling to let Dr. Denyse Amass know about her hip injection and that her hip is worse since getting it. She has been messaging Dr.Corey and is seeing Dr. Roda Shutters but can't get appointment sooner than Tuesday of next week. Left hip and leg are inflamed. Patient wants to know if he thinks it is good idea to get prednisone. Patient states injection made it worse. Please advise.

## 2023-12-27 NOTE — Telephone Encounter (Signed)
 I did prescribe prednisone.  Limit know if we need stronger medicines.  Medicine sent [place]

## 2023-12-31 NOTE — Progress Notes (Unsigned)
   Office Visit Note   Patient: Sandra Blair           Date of Birth: May 16, 1966           MRN: [id number] Visit Date: 01/02/2024              Requested by:  Loyal Jacobson, MD [address] [place],  [place] [address]  PCP: Loyal Jacobson, MD   Assessment & Plan: Visit Diagnoses: No diagnosis found.  Plan: ***  Follow-Up Instructions: No follow-ups on file.   Orders:  No orders of the defined types were placed in this encounter. No orders of the defined types were placed in this encounter.    Procedures: No procedures performed   Clinical Data: No additional findings.   Subjective: No chief complaint on file.  HPI  Review of Systems   Objective: Vital Signs: There were no vitals taken for this visit.  Physical Exam  Ortho Exam  Specialty Comments:  No specialty comments available.  Imaging: No results found.   PMFS History: Patient Active Problem List   Diagnosis Date Noted  . Left foot pain 02/25/2015  . Excessive and frequent menstruation 10/20/2008  . Female genuine stress incontinence 10/08/2007  . Adiposity 10/08/2007  . Menstrual molimen 10/08/2007   No past medical history on file.  Family History  Problem Relation Age of Onset  . Breast cancer Neg Hx     Past Surgical History:  Procedure Laterality Date  . FOOT OSTEOTOMY Left    Social History   Occupational History  . Occupation: Child psychotherapist: [employer]    Comment: [employer]  Tobacco Use  . Smoking status: Never  . Smokeless tobacco: Not on file  Substance and Sexual Activity  . Alcohol use: Yes    Comment: occ  . Drug use: No  . Sexual activity: Not on file

## 2024-01-02 ENCOUNTER — Ambulatory Visit: Payer: Managed Care, Other (non HMO) | Admitting: Orthopaedic Surgery

## 2024-01-02 ENCOUNTER — Encounter: Payer: Self-pay | Admitting: Orthopaedic Surgery

## 2024-01-02 DIAGNOSIS — M1612 Unilateral primary osteoarthritis, left hip: Secondary | ICD-10-CM

## 2024-01-02 HISTORY — DX: Unilateral primary osteoarthritis, left hip: M16.12

## 2024-01-05 ENCOUNTER — Encounter: Payer: Self-pay | Admitting: Orthopaedic Surgery

## 2024-01-09 ENCOUNTER — Telehealth: Payer: Self-pay | Admitting: Orthopaedic Surgery

## 2024-01-09 NOTE — Telephone Encounter (Signed)
 Left message on patient's voice mail providing my name and direct number to call and schedule left total hip with Dr. Roda Shutters.  Note on surgery order indicates patient might want to schedule in May 2025.

## 2024-01-11 ENCOUNTER — Telehealth: Payer: Self-pay | Admitting: Orthopaedic Surgery

## 2024-01-11 NOTE — Telephone Encounter (Signed)
 IC, spoke with patient. She is having THA 5/5. I will call her to pick up forms once completed.

## 2024-01-11 NOTE — Telephone Encounter (Signed)
 Pt submitted FMLA forms, medical release, and payment of $20.00 cash. Accepted 01/11/2024

## 2024-01-12 ENCOUNTER — Other Ambulatory Visit: Payer: Self-pay | Admitting: Family Medicine

## 2024-01-12 DIAGNOSIS — Z0000 Encounter for general adult medical examination without abnormal findings: Secondary | ICD-10-CM

## 2024-01-18 ENCOUNTER — Encounter: Payer: Self-pay | Admitting: Family Medicine

## 2024-01-18 DIAGNOSIS — M5412 Radiculopathy, cervical region: Secondary | ICD-10-CM

## 2024-01-19 ENCOUNTER — Encounter: Payer: Self-pay | Admitting: Family Medicine

## 2024-01-22 NOTE — Telephone Encounter (Signed)
 Forwarding to Dr. Denyse Amass as Lorain Childes.

## 2024-01-23 NOTE — Telephone Encounter (Signed)
 No objections.  Thanks for asking.  Hope you're doing well.

## 2024-01-30 ENCOUNTER — Encounter: Payer: Self-pay | Admitting: Orthopaedic Surgery

## 2024-01-30 NOTE — Discharge Instructions (Signed)

## 2024-02-01 ENCOUNTER — Ambulatory Visit
Admission: RE | Admit: 2024-02-01 | Discharge: 2024-02-01 | Disposition: A | Discharge status: Home / Self Care | Source: Ambulatory Visit | Attending: Family Medicine | Admitting: Family Medicine

## 2024-02-01 ENCOUNTER — Ambulatory Visit
Admission: RE | Admit: 2024-02-01 | Discharge: 2024-02-01 | Discharge status: Home / Self Care | Source: Ambulatory Visit | Attending: Family Medicine | Admitting: Family Medicine

## 2024-02-01 DIAGNOSIS — Z0000 Encounter for general adult medical examination without abnormal findings: Secondary | ICD-10-CM

## 2024-02-01 DIAGNOSIS — M5412 Radiculopathy, cervical region: Secondary | ICD-10-CM

## 2024-02-01 MED ORDER — IOPAMIDOL (ISOVUE-M 300) INJECTION 61%
1.0000 mL | Freq: Once | INTRAMUSCULAR | Status: AC
  Administered 2024-02-01: 1 mL via EPIDURAL

## 2024-02-01 MED ORDER — TRIAMCINOLONE ACETONIDE 40 MG/ML IJ SUSP (RADIOLOGY)
60.0000 mg | Freq: Once | INTRAMUSCULAR | Status: AC
  Administered 2024-02-01: 60 mg via EPIDURAL

## 2024-02-15 ENCOUNTER — Encounter (HOSPITAL_COMMUNITY): Payer: Self-pay

## 2024-02-15 NOTE — Pre-Procedure Instructions (Signed)
 Surgical Instructions   Your procedure is scheduled on Monday, May 5th. Report to [facility] Main Entrance "A" at 07:30 A.M., then check in with the Admitting office. Any questions or running late day of surgery: call [phone]  Questions prior to your surgery date: call [phone], Monday-Friday, 8am-4pm. If you experience any cold or flu symptoms such as cough, fever, chills, shortness of breath, etc. between now and your scheduled surgery, please notify us  at the above number.     Remember:  Do not eat after midnight the night before your surgery  You may drink clear liquids until 07:00 AM the morning of your surgery.   Clear liquids allowed are: Water, Non-Citrus Juices (without pulp), Carbonated Beverages, Clear Tea (no milk, honey, etc.), Black Coffee Only (NO MILK, CREAM OR POWDERED CREAMER of any kind), and Gatorade.  Patient Instructions  The night before surgery:  No food after midnight. ONLY clear liquids after midnight  The day of surgery (if you do NOT have diabetes):  Drink ONE (1) Pre-Surgery Clear Ensure by 07:00 AM the morning of surgery. Drink in one sitting. Do not sip.  This drink was given to you during your hospital  pre-op appointment visit.  Nothing else to drink after completing the  Pre-Surgery Clear Ensure.         If you have questions, please contact your surgeon's office.    Take these medicines the morning of surgery with A SIP OF WATER  omeprazole (PRILOSEC)    May take these medicines IF NEEDED: Polyethyl Glycol-Propyl Glycol (SYSTANE OP)    One week prior to surgery, STOP taking any Aspirin  (unless otherwise instructed by your surgeon) Aleve, Naproxen, Ibuprofen, Motrin, Advil, Goody's, BC's, all herbal medications, fish oil, and non-prescription vitamins. This includes diclofenac  (VOLTAREN ).                     Do NOT Smoke (Tobacco/Vaping) for 24 hours prior to your procedure.  If you use a CPAP at night, you may bring your  mask/headgear for your overnight stay.   You will be asked to remove any contacts, glasses, piercing's, hearing aid's, dentures/partials prior to surgery. Please bring cases for these items if needed.    Patients discharged the day of surgery will not be allowed to drive home, and someone needs to stay with them for 24 hours.  SURGICAL WAITING ROOM VISITATION Patients may have no more than 2 support people in the waiting area - these visitors may rotate.   Pre-op nurse will coordinate an appropriate time for 1 ADULT support person, who may not rotate, to accompany patient in pre-op.  Children under the age of 31 must have an adult with them who is not the patient and must remain in the main waiting area with an adult.  If the patient needs to stay at the hospital during part of their recovery, the visitor guidelines for inpatient rooms apply.  Please refer to the [facility] website for the visitor guidelines for any additional information.   If you received a COVID test during your pre-op visit  it is requested that you wear a mask when out in public, stay away from anyone that may not be feeling well and notify your surgeon if you develop symptoms. If you have been in contact with anyone that has tested positive in the last 10 days please notify you surgeon.      Pre-operative 5 CHG Bathing Instructions   You can play a key  role in reducing the risk of infection after surgery. Your skin needs to be as free of germs as possible. You can reduce the number of germs on your skin by washing with CHG (chlorhexidine gluconate) soap before surgery. CHG is an antiseptic soap that kills germs and continues to kill germs even after washing.   DO NOT use if you have an allergy to chlorhexidine/CHG or antibacterial soaps. If your skin becomes reddened or irritated, stop using the CHG and notify one of our RNs at [phone].   Please shower with the CHG soap starting 4 days before surgery using the  following schedule:     Please keep in mind the following:  DO NOT shave, including legs and underarms, starting the day of your first shower.   You may shave your face at any point before/day of surgery.  Place clean sheets on your bed the day you start using CHG soap. Use a clean washcloth (not used since being washed) for each shower. DO NOT sleep with pets once you start using the CHG.   CHG Shower Instructions:  Wash your face and private area with normal soap. If you choose to wash your hair, wash first with your normal shampoo.  After you use shampoo/soap, rinse your hair and body thoroughly to remove shampoo/soap residue.  Turn the water OFF and apply about 3 tablespoons (45 ml) of CHG soap to a CLEAN washcloth.  Apply CHG soap ONLY FROM YOUR NECK DOWN TO YOUR TOES (washing for 3-5 minutes)  DO NOT use CHG soap on face, private areas, open wounds, or sores.  Pay special attention to the area where your surgery is being performed.  If you are having back surgery, having someone wash your back for you may be helpful. Wait 2 minutes after CHG soap is applied, then you may rinse off the CHG soap.  Pat dry with a clean towel  Put on clean clothes/pajamas   If you choose to wear lotion, please use ONLY the CHG-compatible lotions that are listed below.  Additional instructions for the day of surgery: DO NOT APPLY any lotions, deodorants, cologne, or perfumes.   Do not bring valuables to the hospital. [facility] is not responsible for any belongings/valuables. Do not wear nail polish, gel polish, artificial nails, or any other type of covering on natural nails (fingers and toes) Do not wear jewelry or makeup Put on clean/comfortable clothes.  Please brush your teeth.  Ask your nurse before applying any prescription medications to the skin.     CHG Compatible Lotions   Aveeno Moisturizing lotion  Cetaphil Moisturizing Cream  Cetaphil Moisturizing Lotion  Clairol Herbal  Essence Moisturizing Lotion, Dry Skin  Clairol Herbal Essence Moisturizing Lotion, Extra Dry Skin  Clairol Herbal Essence Moisturizing Lotion, Normal Skin  Curel Age Defying Therapeutic Moisturizing Lotion with Alpha Hydroxy  Curel Extreme Care Body Lotion  Curel Soothing Hands Moisturizing Hand Lotion  Curel Therapeutic Moisturizing Cream, Fragrance-Free  Curel Therapeutic Moisturizing Lotion, Fragrance-Free  Curel Therapeutic Moisturizing Lotion, Original Formula  Eucerin Daily Replenishing Lotion  Eucerin Dry Skin Therapy Plus Alpha Hydroxy Crme  Eucerin Dry Skin Therapy Plus Alpha Hydroxy Lotion  Eucerin Original Crme  Eucerin Original Lotion  Eucerin Plus Crme Eucerin Plus Lotion  Eucerin TriLipid Replenishing Lotion  Keri Anti-Bacterial Hand Lotion  Keri Deep Conditioning Original Lotion Dry Skin Formula Softly Scented  Keri Deep Conditioning Original Lotion, Fragrance Free Sensitive Skin Formula  Keri Lotion Fast Absorbing Fragrance Free Sensitive Skin Formula  Keri Lotion Fast Absorbing Softly Scented Dry Skin Formula  Keri Original Lotion  Keri Skin Renewal Lotion Keri Silky Smooth Lotion  Keri Silky Smooth Sensitive Skin Lotion  Nivea Body Creamy Conditioning Patent examiner Moisturizing Lotion Nivea Crme  Nivea Skin Firming Lotion  NutraDerm 30 Skin Lotion  NutraDerm Skin Lotion  NutraDerm Therapeutic Skin Cream  NutraDerm Therapeutic Skin Lotion  ProShield Protective Hand Cream  Provon moisturizing lotion  Please read over the following fact sheets that you were given.

## 2024-02-15 NOTE — Progress Notes (Signed)
 PCP - [facility]  Cardiologist - none  Chest x-ray - n/a EKG - n/a Stress Test - 01/14/21 CE ECHO - 01/14/21 CE Cardiac Cath - n/a  ICD Pacemaker/Loop - n/a  Sleep Study -  n/a  Diabetes - n/a  Aspirin & Blood Thinner Instructions:  n/a  ERAS - clear liquids til 7 AM DOS PRE-SURGERY Ensure given at PAT appointment with instructions.  This will be the last thing that you will drink on the morning of surgery.   Anesthesia review: no  STOP now taking any Aspirin (unless otherwise instructed by your surgeon), Aleve, Naproxen, Ibuprofen, Motrin, Advil, Goody's, BC's, all herbal medications, fish oil, and all vitamins.   Coronavirus Screening Do you have any of the following symptoms:  Cough yes/no: No Fever (>100.76F)  yes/no: No Runny nose yes/no: No Sore throat yes/no: No Difficulty breathing/shortness of breath  yes/no: No  Have you traveled in the last 14 days and where? yes/no: No  Patient verbalized understanding of instructions that were given to them at the PAT appointment. Patient was also instructed that they will need to review over the PAT instructions again at home before surgery.

## 2024-02-16 ENCOUNTER — Encounter (HOSPITAL_COMMUNITY): Payer: Self-pay

## 2024-02-16 ENCOUNTER — Other Ambulatory Visit: Payer: Self-pay

## 2024-02-16 ENCOUNTER — Encounter (HOSPITAL_COMMUNITY)
Admission: RE | Admit: 2024-02-16 | Discharge: 2024-02-16 | Disposition: A | Discharge status: Home / Self Care | Source: Ambulatory Visit | Attending: Orthopaedic Surgery | Admitting: Orthopaedic Surgery

## 2024-02-16 VITALS — BP 113/71 | HR 51 | Temp 98.3°F | Resp 18 | Ht 69.5 in | Wt 186.2 lb

## 2024-02-16 DIAGNOSIS — M1612 Unilateral primary osteoarthritis, left hip: Secondary | ICD-10-CM | POA: Diagnosis not present

## 2024-02-16 DIAGNOSIS — Z01818 Encounter for other preprocedural examination: Secondary | ICD-10-CM | POA: Diagnosis present

## 2024-02-16 DIAGNOSIS — Z01812 Encounter for preprocedural laboratory examination: Secondary | ICD-10-CM | POA: Insufficient documentation

## 2024-02-16 HISTORY — DX: Gastro-esophageal reflux disease without esophagitis: K21.9

## 2024-02-16 HISTORY — DX: Anemia, unspecified: D64.9

## 2024-02-16 LAB — CBC
HCT: 41.9 % (ref 36.0–46.0)
Hemoglobin: 14 g/dL (ref 12.0–15.0)
MCH: 31.3 pg (ref 26.0–34.0)
MCHC: 33.4 g/dL (ref 30.0–36.0)
MCV: 93.7 fL (ref 80.0–100.0)
Platelets: 252 K/uL (ref 150–400)
RBC: 4.47 MIL/uL (ref 3.87–5.11)
RDW: 12 % (ref 11.5–15.5)
WBC: 9.1 K/uL (ref 4.0–10.5)
nRBC: 0 % (ref 0.0–0.2)

## 2024-02-16 LAB — BASIC METABOLIC PANEL WITH GFR
Anion gap: 8 (ref 5–15)
BUN: 13 mg/dL (ref 6–20)
CO2: 29 mmol/L (ref 22–32)
Calcium: 9.1 mg/dL (ref 8.9–10.3)
Chloride: 102 mmol/L (ref 98–111)
Creatinine, Ser: 0.72 mg/dL (ref 0.44–1.00)
GFR, Estimated: >60 mL/min
Glucose, Bld: 97 mg/dL (ref 70–99)
Potassium: 3.9 mmol/L (ref 3.5–5.1)
Sodium: 139 mmol/L (ref 135–145)

## 2024-02-16 LAB — TYPE AND SCREEN
ABO/RH(D): B NEG
Antibody Screen: NEGATIVE

## 2024-02-16 LAB — SURGICAL PCR SCREEN
MRSA, PCR: NEGATIVE
Staphylococcus aureus: NEGATIVE

## 2024-02-16 NOTE — Pre-Procedure Instructions (Signed)
 Surgical Instructions   Your procedure is scheduled on Monday, May 5th. Report to [facility] Main Entrance "A" at 08:00 A.M., then check in with the Admitting Office. Any questions or running late day of surgery: call [phone]  Questions prior to your surgery date: call [phone], Monday-Friday, 8am-4pm. If you experience any cold or flu symptoms such as cough, fever, chills, shortness of breath, etc. between now and your scheduled surgery, please notify us  at the above number.     Remember:  Do not eat after midnight the night before your surgery-Sun.  You may drink clear liquids until 07:00 AM the morning of your surgery-Mon:   Clear liquids allowed are: Water, Non-Citrus Juices (without pulp), Carbonated Beverages, Clear Tea (no milk, honey, etc.), Black Coffee Only (NO MILK, CREAM OR POWDERED CREAMER of any kind), and Gatorade.  Patient Instructions  The night before surgery:  No food after midnight. ONLY clear liquids after midnight  The day of surgery (if you do NOT have diabetes):  Drink ONE (1) Pre-Surgery Clear Ensure by 07:00 AM the morning of surgery. Drink in one sitting. Do not sip.  This drink was given to you during your hospital  pre-op appointment visit.  Nothing else to drink after completing the  Pre-Surgery Clear Ensure.         If you have questions, please contact your surgeon's office.    Take these medicines the morning of surgery with A SIP OF WATER  omeprazole (PRILOSEC)    May take these medicines IF NEEDED: Polyethyl Glycol-Propyl Glycol (SYSTANE OP)    One week prior to surgery, STOP taking any Aspirin  (unless otherwise instructed by your surgeon) Aleve, Naproxen, Ibuprofen, Motrin, Advil, Goody's, BC's, all herbal medications, fish oil, and non-prescription vitamins. This includes diclofenac  (VOLTAREN ).                     Do NOT Smoke (Tobacco/Vaping) for 24 hours prior to your procedure.  If you use a CPAP at night, you may bring  your mask/headgear for your overnight stay.   You will be asked to remove any contacts, glasses, piercing's, hearing aid's, dentures/partials prior to surgery. Please bring cases for these items if needed.    Patients discharged the day of surgery will not be allowed to drive home, and someone needs to stay with them for 24 hours.  SURGICAL WAITING ROOM VISITATION Patients may have no more than 2 support people in the waiting area - these visitors may rotate.   Pre-op nurse will coordinate an appropriate time for 1 ADULT support person, who may not rotate, to accompany patient in pre-op.  Children under the age of 65 must have an adult with them who is not the patient and must remain in the main waiting area with an adult.  If the patient needs to stay at the hospital during part of their recovery, the visitor guidelines for inpatient rooms apply.  Please refer to the [facility] website for the visitor guidelines for any additional information.   If you received a COVID test during your pre-op visit  it is requested that you wear a mask when out in public, stay away from anyone that may not be feeling well and notify your surgeon if you develop symptoms. If you have been in contact with anyone that has tested positive in the last 10 days please notify you surgeon.      Pre-operative 5 CHG Bathing Instructions   You can play a key  role in reducing the risk of infection after surgery. Your skin needs to be as free of germs as possible. You can reduce the number of germs on your skin by washing with CHG (chlorhexidine gluconate) soap before surgery. CHG is an antiseptic soap that kills germs and continues to kill germs even after washing.   DO NOT use if you have an allergy to chlorhexidine/CHG or antibacterial soaps. If your skin becomes reddened or irritated, stop using the CHG and notify one of our RNs at [phone].   Please shower with the CHG soap starting 4 days before surgery using  the following schedule:     Please keep in mind the following:  DO NOT shave, including legs and underarms, starting the day of your first shower.   You may shave your face at any point before/day of surgery.  Place clean sheets on your bed the day you start using CHG soap. Use a clean washcloth (not used since being washed) for each shower. DO NOT sleep with pets once you start using the CHG.   CHG Shower Instructions:  Wash your face and private area with normal soap. If you choose to wash your hair, wash first with your normal shampoo.  After you use shampoo/soap, rinse your hair and body thoroughly to remove shampoo/soap residue.  Turn the water OFF and apply about 3 tablespoons (45 ml) of CHG soap to a CLEAN washcloth.  Apply CHG soap ONLY FROM YOUR NECK DOWN TO YOUR TOES (washing for 3-5 minutes)  DO NOT use CHG soap on face, private areas, open wounds, or sores.  Pay special attention to the area where your surgery is being performed.  If you are having back surgery, having someone wash your back for you may be helpful. Wait 2 minutes after CHG soap is applied, then you may rinse off the CHG soap.  Pat dry with a clean towel  Put on clean clothes/pajamas   If you choose to wear lotion, please use ONLY the CHG-compatible lotions that are listed below.  Additional instructions for the day of surgery: DO NOT APPLY any lotions, deodorants, cologne, or perfumes.   Do not bring valuables to the hospital. [facility] is not responsible for any belongings/valuables. Do not wear nail polish, gel polish, artificial nails, or any other type of covering on natural nails (fingers and toes) Do not wear jewelry or makeup Put on clean/comfortable clothes.  Please brush your teeth.  Ask your nurse before applying any prescription medications to the skin.     CHG Compatible Lotions   Aveeno Moisturizing lotion  Cetaphil Moisturizing Cream  Cetaphil Moisturizing Lotion  Clairol Herbal  Essence Moisturizing Lotion, Dry Skin  Clairol Herbal Essence Moisturizing Lotion, Extra Dry Skin  Clairol Herbal Essence Moisturizing Lotion, Normal Skin  Curel Age Defying Therapeutic Moisturizing Lotion with Alpha Hydroxy  Curel Extreme Care Body Lotion  Curel Soothing Hands Moisturizing Hand Lotion  Curel Therapeutic Moisturizing Cream, Fragrance-Free  Curel Therapeutic Moisturizing Lotion, Fragrance-Free  Curel Therapeutic Moisturizing Lotion, Original Formula  Eucerin Daily Replenishing Lotion  Eucerin Dry Skin Therapy Plus Alpha Hydroxy Crme  Eucerin Dry Skin Therapy Plus Alpha Hydroxy Lotion  Eucerin Original Crme  Eucerin Original Lotion  Eucerin Plus Crme Eucerin Plus Lotion  Eucerin TriLipid Replenishing Lotion  Keri Anti-Bacterial Hand Lotion  Keri Deep Conditioning Original Lotion Dry Skin Formula Softly Scented  Keri Deep Conditioning Original Lotion, Fragrance Free Sensitive Skin Formula  Keri Lotion Fast Absorbing Fragrance Free Sensitive Skin Formula  Keri Lotion Fast Absorbing Softly Scented Dry Skin Formula  Keri Original Lotion  Keri Skin Renewal Lotion Keri Silky Smooth Lotion  Keri Silky Smooth Sensitive Skin Lotion  Nivea Body Creamy Conditioning Patent examiner Moisturizing Lotion Nivea Crme  Nivea Skin Firming Lotion  NutraDerm 30 Skin Lotion  NutraDerm Skin Lotion  NutraDerm Therapeutic Skin Cream  NutraDerm Therapeutic Skin Lotion  ProShield Protective Hand Cream  Provon moisturizing lotion  Please read over the following fact sheets that you were given.

## 2024-02-19 ENCOUNTER — Telehealth: Payer: Self-pay | Admitting: Orthopaedic Surgery

## 2024-02-21 ENCOUNTER — Other Ambulatory Visit: Payer: Self-pay | Admitting: Physician Assistant

## 2024-02-21 MED ORDER — METHOCARBAMOL 750 MG PO TABS: 750.0000 mg | ORAL_TABLET | Freq: Three times a day (TID) | ORAL | 2 refills | Status: AC | PRN

## 2024-02-21 MED ORDER — ONDANSETRON HCL 4 MG PO TABS: 4.0000 mg | ORAL_TABLET | Freq: Three times a day (TID) | ORAL | 0 refills | Status: AC | PRN

## 2024-02-21 MED ORDER — OXYCODONE-ACETAMINOPHEN 5-325 MG PO TABS: 1.0000 | ORAL_TABLET | Freq: Four times a day (QID) | ORAL | 0 refills | Status: DC | PRN

## 2024-02-21 MED ORDER — DOCUSATE SODIUM 100 MG PO CAPS: 100.0000 mg | ORAL_CAPSULE | Freq: Every day | ORAL | 2 refills | Status: AC | PRN

## 2024-02-21 MED ORDER — ASPIRIN 81 MG PO CHEW: 81.0000 mg | CHEWABLE_TABLET | Freq: Two times a day (BID) | ORAL | 0 refills | Status: DC

## 2024-02-23 MED ORDER — TRANEXAMIC ACID 1000 MG/10ML IV SOLN: 2000.0000 mg | INTRAVENOUS | Status: DC

## 2024-02-23 MED ORDER — TRANEXAMIC ACID 1000 MG/10ML IV SOLN: 2000.0000 mg | Freq: Once | INTRAVENOUS | Status: DC

## 2024-02-25 DIAGNOSIS — M1612 Unilateral primary osteoarthritis, left hip: Secondary | ICD-10-CM | POA: Insufficient documentation

## 2024-02-26 ENCOUNTER — Ambulatory Visit (HOSPITAL_COMMUNITY)

## 2024-02-26 ENCOUNTER — Encounter (HOSPITAL_COMMUNITY)
Admission: RE | Disposition: A | Discharge status: Home / Self Care | Payer: Self-pay | Source: Home / Self Care | Attending: Orthopaedic Surgery

## 2024-02-26 ENCOUNTER — Other Ambulatory Visit: Payer: Self-pay

## 2024-02-26 ENCOUNTER — Observation Stay (HOSPITAL_COMMUNITY)
Admission: RE | Admit: 2024-02-26 | Discharge: 2024-02-27 | Disposition: A | Discharge status: Home / Self Care | Attending: Orthopaedic Surgery | Admitting: Orthopaedic Surgery

## 2024-02-26 ENCOUNTER — Other Ambulatory Visit: Payer: Self-pay | Admitting: Physician Assistant

## 2024-02-26 ENCOUNTER — Other Ambulatory Visit (HOSPITAL_COMMUNITY): Payer: Self-pay

## 2024-02-26 ENCOUNTER — Observation Stay (HOSPITAL_COMMUNITY)

## 2024-02-26 DIAGNOSIS — Z96642 Presence of left artificial hip joint: Secondary | ICD-10-CM

## 2024-02-26 DIAGNOSIS — M1612 Unilateral primary osteoarthritis, left hip: Secondary | ICD-10-CM | POA: Diagnosis present

## 2024-02-26 HISTORY — PX: TOTAL HIP ARTHROPLASTY: SHX124

## 2024-02-26 LAB — ABO/RH: ABO/RH(D): B NEG

## 2024-02-26 SURGERY — ARTHROPLASTY, HIP, TOTAL, ANTERIOR APPROACH
Anesthesia: Spinal | Site: Hip | Laterality: Left

## 2024-02-26 MED ORDER — TRANEXAMIC ACID 1000 MG/10ML IV SOLN
INTRAVENOUS | Status: DC | PRN
  Administered 2024-02-26: 2000 mg via TOPICAL

## 2024-02-26 MED ORDER — PHENYLEPHRINE HCL-NACL 20-0.9 MG/250ML-% IV SOLN
INTRAVENOUS | Status: DC | PRN
  Administered 2024-02-26: 50 ug/min via INTRAVENOUS

## 2024-02-26 MED ORDER — MEPERIDINE HCL 25 MG/ML IJ SOLN: 6.2500 mg | INTRAMUSCULAR | Status: DC | PRN

## 2024-02-26 MED ORDER — ORAL CARE MOUTH RINSE: 15.0000 mL | Freq: Once | OROMUCOSAL | Status: AC

## 2024-02-26 MED ORDER — MAGNESIUM CITRATE PO SOLN: 1.0000 | Freq: Once | ORAL | Status: DC | PRN

## 2024-02-26 MED ORDER — ALUM & MAG HYDROXIDE-SIMETH 200-200-20 MG/5ML PO SUSP: 30.0000 mL | ORAL | Status: DC | PRN

## 2024-02-26 MED ORDER — LACTATED RINGERS IV SOLN: INTRAVENOUS | Status: DC

## 2024-02-26 MED ORDER — POVIDONE-IODINE 10 % EX SWAB
2.0000 | Freq: Once | CUTANEOUS | Status: AC
  Administered 2024-02-26: 2 via TOPICAL

## 2024-02-26 MED ORDER — TRAMADOL HCL 50 MG PO TABS
50.0000 mg | ORAL_TABLET | Freq: Four times a day (QID) | ORAL | Status: DC | PRN
  Administered 2024-02-26 – 2024-02-27 (×2): 50 mg via ORAL
  Filled 2024-02-26 (×2): qty 1

## 2024-02-26 MED ORDER — SORBITOL 70 % SOLN: 30.0000 mL | Freq: Every day | Status: DC | PRN

## 2024-02-26 MED ORDER — VANCOMYCIN HCL 1 G IV SOLR
INTRAVENOUS | Status: DC | PRN
  Administered 2024-02-26: 1000 mg

## 2024-02-26 MED ORDER — ONDANSETRON HCL 4 MG/2ML IJ SOLN
INTRAMUSCULAR | Status: DC | PRN
  Administered 2024-02-26: 4 mg via INTRAVENOUS

## 2024-02-26 MED ORDER — KETOROLAC TROMETHAMINE 15 MG/ML IJ SOLN
15.0000 mg | Freq: Four times a day (QID) | INTRAMUSCULAR | Status: DC
  Administered 2024-02-26 – 2024-02-27 (×3): 15 mg via INTRAVENOUS
  Filled 2024-02-26 (×3): qty 1

## 2024-02-26 MED ORDER — CEFAZOLIN SODIUM-DEXTROSE 2-4 GM/100ML-% IV SOLN
2.0000 g | INTRAVENOUS | Status: AC
  Administered 2024-02-26: 2 g via INTRAVENOUS
  Filled 2024-02-26: qty 100

## 2024-02-26 MED ORDER — OXYCODONE HCL 5 MG PO TABS: 5.0000 mg | ORAL_TABLET | Freq: Once | ORAL | Status: DC | PRN

## 2024-02-26 MED ORDER — MIDAZOLAM HCL 2 MG/2ML IJ SOLN
INTRAMUSCULAR | Status: AC
  Filled 2024-02-26: qty 2

## 2024-02-26 MED ORDER — DIPHENHYDRAMINE HCL 12.5 MG/5ML PO ELIX: 25.0000 mg | ORAL_SOLUTION | ORAL | Status: DC | PRN

## 2024-02-26 MED ORDER — PHENOL 1.4 % MT LIQD: 1.0000 | OROMUCOSAL | Status: DC | PRN

## 2024-02-26 MED ORDER — FENTANYL CITRATE (PF) 250 MCG/5ML IJ SOLN
INTRAMUSCULAR | Status: DC | PRN
  Administered 2024-02-26 (×2): 50 ug via INTRAVENOUS

## 2024-02-26 MED ORDER — HYDROMORPHONE HCL 1 MG/ML IJ SOLN
INTRAMUSCULAR | Status: AC
Start: 2024-02-26 — End: 2024-02-27
  Filled 2024-02-26: qty 1

## 2024-02-26 MED ORDER — PANTOPRAZOLE SODIUM 40 MG PO TBEC
40.0000 mg | DELAYED_RELEASE_TABLET | Freq: Every day | ORAL | Status: DC
  Administered 2024-02-26: 40 mg via ORAL
  Filled 2024-02-26: qty 1

## 2024-02-26 MED ORDER — APIXABAN 2.5 MG PO TABS
2.5000 mg | ORAL_TABLET | Freq: Two times a day (BID) | ORAL | Status: DC
  Administered 2024-02-27: 2.5 mg via ORAL
  Filled 2024-02-26 (×2): qty 1

## 2024-02-26 MED ORDER — 0.9 % SODIUM CHLORIDE (POUR BTL) OPTIME
TOPICAL | Status: DC | PRN
  Administered 2024-02-26: 1000 mL

## 2024-02-26 MED ORDER — HYDROMORPHONE HCL 1 MG/ML IJ SOLN
INTRAMUSCULAR | Status: AC
  Filled 2024-02-26: qty 1

## 2024-02-26 MED ORDER — OXYCODONE HCL 5 MG PO TABS
5.0000 mg | ORAL_TABLET | ORAL | Status: DC | PRN
  Administered 2024-02-26: 10 mg via ORAL

## 2024-02-26 MED ORDER — METOCLOPRAMIDE HCL 5 MG/ML IJ SOLN
5.0000 mg | Freq: Three times a day (TID) | INTRAMUSCULAR | Status: DC | PRN
  Administered 2024-02-26: 10 mg via INTRAVENOUS
  Filled 2024-02-26: qty 2

## 2024-02-26 MED ORDER — OXYCODONE HCL 5 MG PO TABS: 10.0000 mg | ORAL_TABLET | ORAL | Status: DC | PRN

## 2024-02-26 MED ORDER — APIXABAN 2.5 MG PO TABS
2.5000 mg | ORAL_TABLET | Freq: Two times a day (BID) | ORAL | 0 refills | Status: AC
  Filled 2024-02-26: qty 60, 30d supply, fill #0

## 2024-02-26 MED ORDER — VANCOMYCIN HCL 1000 MG IV SOLR
INTRAVENOUS | Status: AC
  Filled 2024-02-26: qty 20

## 2024-02-26 MED ORDER — GLYCOPYRROLATE PF 0.2 MG/ML IJ SOSY
PREFILLED_SYRINGE | INTRAMUSCULAR | Status: AC
  Filled 2024-02-26: qty 1

## 2024-02-26 MED ORDER — BUPIVACAINE IN DEXTROSE 0.75-8.25 % IT SOLN
INTRATHECAL | Status: DC | PRN
  Administered 2024-02-26: 13.5 mg via INTRATHECAL

## 2024-02-26 MED ORDER — ACETAMINOPHEN 325 MG PO TABS: 325.0000 mg | ORAL_TABLET | Freq: Four times a day (QID) | ORAL | Status: DC | PRN

## 2024-02-26 MED ORDER — DEXAMETHASONE SODIUM PHOSPHATE 10 MG/ML IJ SOLN
10.0000 mg | Freq: Once | INTRAMUSCULAR | Status: AC
  Administered 2024-02-27: 10 mg via INTRAVENOUS
  Filled 2024-02-26: qty 1

## 2024-02-26 MED ORDER — PROPOFOL 10 MG/ML IV BOLUS
INTRAVENOUS | Status: AC
  Filled 2024-02-26: qty 20

## 2024-02-26 MED ORDER — HYDROMORPHONE HCL 1 MG/ML IJ SOLN
0.2500 mg | INTRAMUSCULAR | Status: DC | PRN
  Administered 2024-02-26 (×3): 0.5 mg via INTRAVENOUS

## 2024-02-26 MED ORDER — PRONTOSAN WOUND IRRIGATION OPTIME
TOPICAL | Status: DC | PRN
  Administered 2024-02-26: 150

## 2024-02-26 MED ORDER — MENTHOL 3 MG MT LOZG: 1.0000 | LOZENGE | OROMUCOSAL | Status: DC | PRN

## 2024-02-26 MED ORDER — CHLORHEXIDINE GLUCONATE 0.12 % MT SOLN
OROMUCOSAL | Status: AC
  Administered 2024-02-26: 15 mL via OROMUCOSAL
  Filled 2024-02-26: qty 15

## 2024-02-26 MED ORDER — CHLORHEXIDINE GLUCONATE 0.12 % MT SOLN
15.0000 mL | Freq: Once | OROMUCOSAL | Status: AC
  Filled 2024-02-26: qty 15

## 2024-02-26 MED ORDER — ONDANSETRON HCL 4 MG/2ML IJ SOLN: 4.0000 mg | Freq: Four times a day (QID) | INTRAMUSCULAR | Status: DC | PRN

## 2024-02-26 MED ORDER — BUPIVACAINE-MELOXICAM ER 400-12 MG/14ML IJ SOLN
INTRAMUSCULAR | Status: AC
  Filled 2024-02-26: qty 1

## 2024-02-26 MED ORDER — GABAPENTIN 300 MG PO CAPS
600.0000 mg | ORAL_CAPSULE | Freq: Every day | ORAL | Status: DC
  Administered 2024-02-26: 600 mg via ORAL
  Filled 2024-02-26: qty 2

## 2024-02-26 MED ORDER — CEFAZOLIN SODIUM-DEXTROSE 2-4 GM/100ML-% IV SOLN
2.0000 g | Freq: Four times a day (QID) | INTRAVENOUS | Status: AC
  Administered 2024-02-26 (×2): 2 g via INTRAVENOUS
  Filled 2024-02-26 (×2): qty 100

## 2024-02-26 MED ORDER — TRANEXAMIC ACID-NACL 1000-0.7 MG/100ML-% IV SOLN
1000.0000 mg | Freq: Once | INTRAVENOUS | Status: AC
  Administered 2024-02-26: 1000 mg via INTRAVENOUS
  Filled 2024-02-26: qty 100

## 2024-02-26 MED ORDER — MIDAZOLAM HCL 2 MG/2ML IJ SOLN
INTRAMUSCULAR | Status: DC | PRN
  Administered 2024-02-26: 2 mg via INTRAVENOUS

## 2024-02-26 MED ORDER — METHOCARBAMOL 1000 MG/10ML IJ SOLN
500.0000 mg | Freq: Four times a day (QID) | INTRAMUSCULAR | Status: DC | PRN
  Administered 2024-02-26: 500 mg via INTRAVENOUS

## 2024-02-26 MED ORDER — DEXAMETHASONE SODIUM PHOSPHATE 10 MG/ML IJ SOLN
INTRAMUSCULAR | Status: AC
  Filled 2024-02-26: qty 1

## 2024-02-26 MED ORDER — SODIUM CHLORIDE 0.9 % IR SOLN
Status: DC | PRN
  Administered 2024-02-26: 1000 mL

## 2024-02-26 MED ORDER — METHOCARBAMOL 500 MG PO TABS
500.0000 mg | ORAL_TABLET | Freq: Four times a day (QID) | ORAL | Status: DC | PRN
  Administered 2024-02-26: 500 mg via ORAL
  Filled 2024-02-26 (×2): qty 1

## 2024-02-26 MED ORDER — PROPOFOL 10 MG/ML IV BOLUS
INTRAVENOUS | Status: DC | PRN
  Administered 2024-02-26: 30 mg via INTRAVENOUS
  Administered 2024-02-26: 20 mg via INTRAVENOUS
  Administered 2024-02-26: 75 ug/kg/min via INTRAVENOUS

## 2024-02-26 MED ORDER — METHOCARBAMOL 1000 MG/10ML IJ SOLN
INTRAMUSCULAR | Status: AC
Start: 2024-02-26 — End: 2024-02-27
  Filled 2024-02-26: qty 10

## 2024-02-26 MED ORDER — DOCUSATE SODIUM 100 MG PO CAPS
100.0000 mg | ORAL_CAPSULE | Freq: Two times a day (BID) | ORAL | Status: DC
  Administered 2024-02-26 (×2): 100 mg via ORAL
  Filled 2024-02-26 (×2): qty 1

## 2024-02-26 MED ORDER — ACETAMINOPHEN 500 MG PO TABS
1000.0000 mg | ORAL_TABLET | Freq: Once | ORAL | Status: AC
  Administered 2024-02-26: 1000 mg via ORAL
  Filled 2024-02-26: qty 2

## 2024-02-26 MED ORDER — OXYCODONE HCL ER 10 MG PO T12A
10.0000 mg | EXTENDED_RELEASE_TABLET | Freq: Two times a day (BID) | ORAL | Status: DC
  Administered 2024-02-26: 10 mg via ORAL
  Filled 2024-02-26: qty 1

## 2024-02-26 MED ORDER — ACETAMINOPHEN 500 MG PO TABS
1000.0000 mg | ORAL_TABLET | Freq: Four times a day (QID) | ORAL | Status: AC
  Administered 2024-02-26 – 2024-02-27 (×3): 1000 mg via ORAL
  Filled 2024-02-26 (×3): qty 2

## 2024-02-26 MED ORDER — HYDROMORPHONE HCL 1 MG/ML IJ SOLN: 0.5000 mg | INTRAMUSCULAR | Status: DC | PRN

## 2024-02-26 MED ORDER — ONDANSETRON HCL 4 MG/2ML IJ SOLN
INTRAMUSCULAR | Status: AC
  Filled 2024-02-26: qty 2

## 2024-02-26 MED ORDER — DEXAMETHASONE SODIUM PHOSPHATE 10 MG/ML IJ SOLN
INTRAMUSCULAR | Status: DC | PRN
Start: 2024-02-26 — End: 2024-02-26
  Administered 2024-02-26: 10 mg via INTRAVENOUS

## 2024-02-26 MED ORDER — METOCLOPRAMIDE HCL 5 MG PO TABS: 5.0000 mg | ORAL_TABLET | Freq: Three times a day (TID) | ORAL | Status: DC | PRN

## 2024-02-26 MED ORDER — FENTANYL CITRATE (PF) 250 MCG/5ML IJ SOLN
INTRAMUSCULAR | Status: AC
  Filled 2024-02-26: qty 5

## 2024-02-26 MED ORDER — TRANEXAMIC ACID 1000 MG/10ML IV SOLN
2000.0000 mg | Freq: Once | INTRAVENOUS | Status: DC
  Filled 2024-02-26: qty 20

## 2024-02-26 MED ORDER — MIDAZOLAM HCL 2 MG/2ML IJ SOLN: 0.5000 mg | Freq: Once | INTRAMUSCULAR | Status: DC | PRN

## 2024-02-26 MED ORDER — TRANEXAMIC ACID-NACL 1000-0.7 MG/100ML-% IV SOLN
1000.0000 mg | INTRAVENOUS | Status: AC
  Administered 2024-02-26: 1000 mg via INTRAVENOUS
  Filled 2024-02-26: qty 100

## 2024-02-26 MED ORDER — POLYETHYLENE GLYCOL 3350 17 G PO PACK: 17.0000 g | PACK | Freq: Every day | ORAL | Status: DC

## 2024-02-26 MED ORDER — OXYCODONE HCL 5 MG/5ML PO SOLN: 5.0000 mg | Freq: Once | ORAL | Status: DC | PRN

## 2024-02-26 MED ORDER — HYDROXYZINE HCL 50 MG/ML IM SOLN
50.0000 mg | Freq: Four times a day (QID) | INTRAMUSCULAR | Status: DC | PRN
  Administered 2024-02-26: 50 mg via INTRAMUSCULAR
  Filled 2024-02-26: qty 1

## 2024-02-26 MED ORDER — ONDANSETRON HCL 4 MG PO TABS: 4.0000 mg | ORAL_TABLET | Freq: Four times a day (QID) | ORAL | Status: DC | PRN

## 2024-02-26 MED ORDER — OXYCODONE HCL 5 MG PO TABS
ORAL_TABLET | ORAL | Status: AC
  Filled 2024-02-26: qty 2

## 2024-02-26 SURGICAL SUPPLY — 52 items
BAG COUNTER SPONGE SURGICOUNT (BAG) ×1 IMPLANT
BAG DECANTER FOR FLEXI CONT (MISCELLANEOUS) ×1 IMPLANT
BLADE SAG 18X100X1.27 (BLADE) ×1 IMPLANT
COVER PERINEAL POST (MISCELLANEOUS) ×1 IMPLANT
COVER SURGICAL LIGHT HANDLE (MISCELLANEOUS) ×1 IMPLANT
CUP ACETAB W/GRIPTION 54 (Plate) IMPLANT
DERMABOND ADVANCED .7 DNX12 (GAUZE/BANDAGES/DRESSINGS) IMPLANT
DRAPE C-ARM 42X72 X-RAY (DRAPES) ×1 IMPLANT
DRAPE POUCH INSTRU U-SHP 10X18 (DRAPES) ×1 IMPLANT
DRAPE STERI IOBAN 125X83 (DRAPES) ×1 IMPLANT
DRAPE U-SHAPE 47X51 STRL (DRAPES) ×2 IMPLANT
DRESSING AQUACEL AG SP 3.5X10 (GAUZE/BANDAGES/DRESSINGS) IMPLANT
DRSG AQUACEL AG ADV 3.5X10 (GAUZE/BANDAGES/DRESSINGS) ×1 IMPLANT
DURAPREP 26ML APPLICATOR (WOUND CARE) ×2 IMPLANT
ELECTRODE BLDE 4.0 EZ CLN MEGD (MISCELLANEOUS) ×1 IMPLANT
ELECTRODE REM PT RTRN 9FT ADLT (ELECTROSURGICAL) ×1 IMPLANT
GLOVE BIOGEL PI IND STRL 7.0 (GLOVE) ×2 IMPLANT
GLOVE BIOGEL PI IND STRL 7.5 (GLOVE) ×5 IMPLANT
GLOVE ECLIPSE 7.0 STRL STRAW (GLOVE) ×2 IMPLANT
GLOVE SKINSENSE STRL SZ7.5 (GLOVE) ×1 IMPLANT
GLOVE SURG SYN 7.5 E (GLOVE) ×2 IMPLANT
GLOVE SURG SYN 7.5 PF PI (GLOVE) ×2 IMPLANT
GLOVE SURG UNDER POLY LF SZ7 (GLOVE) ×3 IMPLANT
GLOVE SURG UNDER POLY LF SZ7.5 (GLOVE) ×2 IMPLANT
GOWN STRL REUS W/ TWL LRG LVL3 (GOWN DISPOSABLE) IMPLANT
GOWN STRL REUS W/ TWL XL LVL3 (GOWN DISPOSABLE) ×1 IMPLANT
GOWN STRL SURGICAL XL XLNG (GOWN DISPOSABLE) ×1 IMPLANT
GOWN TOGA ZIPPER T7+ PEEL AWAY (MISCELLANEOUS) ×1 IMPLANT
HEAD CERAMIC 36 PLUS5 (Hips) IMPLANT
HOOD PEEL AWAY T7 (MISCELLANEOUS) ×1 IMPLANT
IV NS IRRIG 3000ML ARTHROMATIC (IV SOLUTION) ×1 IMPLANT
KIT BASIN OR (CUSTOM PROCEDURE TRAY) ×1 IMPLANT
LINER NEUTRAL 54X36MM PLUS 4 (Hips) IMPLANT
MARKER SKIN DUAL TIP RULER LAB (MISCELLANEOUS) ×1 IMPLANT
NDL SPNL 18GX3.5 QUINCKE PK (NEEDLE) ×1 IMPLANT
NEEDLE SPNL 18GX3.5 QUINCKE PK (NEEDLE) ×1 IMPLANT
PACK TOTAL JOINT (CUSTOM PROCEDURE TRAY) ×1 IMPLANT
PACK UNIVERSAL I (CUSTOM PROCEDURE TRAY) ×1 IMPLANT
SET HNDPC FAN SPRY TIP SCT (DISPOSABLE) ×1 IMPLANT
SOLUTION PRONTOSAN WOUND 350ML (IRRIGATION / IRRIGATOR) ×1 IMPLANT
STEM FEM ACTIS STD SZ4 (Stem) IMPLANT
SUT ETHIBOND 2 V 37 (SUTURE) ×1 IMPLANT
SUT STRATAFIX 1PDS 45CM VIOLET (SUTURE) IMPLANT
SUT VIC AB 0 CT1 27XBRD ANBCTR (SUTURE) ×1 IMPLANT
SUT VIC AB 1 CTX36XBRD ANBCTR (SUTURE) ×1 IMPLANT
SUT VIC AB 2-0 CT1 TAPERPNT 27 (SUTURE) ×2 IMPLANT
SYR 50ML LL SCALE MARK (SYRINGE) ×1 IMPLANT
TOWEL GREEN STERILE (TOWEL DISPOSABLE) ×1 IMPLANT
TRAY CATH INTERMITTENT SS 16FR (CATHETERS) IMPLANT
TRAY FOLEY W/BAG SLVR 16FR ST (SET/KITS/TRAYS/PACK) IMPLANT
TUBE SUCT ARGYLE STRL (TUBING) ×1 IMPLANT
YANKAUER SUCT BULB TIP NO VENT (SUCTIONS) ×1 IMPLANT

## 2024-02-26 NOTE — H&P (Signed)
 PREOPERATIVE H&P  Chief Complaint: left hip osteoarthritis  HPI: Sandra Blair is a 58 y.o. female who presents for surgical treatment of left hip osteoarthritis.  She denies any changes in medical history.  Past Surgical History:  Procedure Laterality Date   COLONOSCOPY  11/24/2023   DIAGNOSTIC LAPAROSCOPY Left    oopherectomy and ablation   FOOT OSTEOTOMY Left    patient denies this procedire on 02/16/24 but states had second toe surgery on left foot   TONSILLECTOMY AND ADENOIDECTOMY     TUBAL LIGATION     WISDOM TOOTH EXTRACTION     Social History   Socioeconomic History   Marital status: Married    Spouse name: Not on file   Number of children: Not on file   Years of education: Not on file   Highest education level: Not on file  Occupational History   Occupation: Child psychotherapist: [employer]    Comment: PRN  Tobacco Use   Smoking status: Former    Current packs/day: 1.00    Types: Cigarettes   Smokeless tobacco: Never   Tobacco comments:    Quit smoking in 12/1995  Vaping Use   Vaping status: Never Used  Substance and Sexual Activity   Alcohol use: Not Currently    Comment: occ   Drug use: No   Sexual activity: Yes    Birth control/protection: Post-menopausal  Other Topics Concern   Not on file  Social History Narrative   Not on file   Social Drivers of Health   Financial Resource Strain: Low Risk  (05/09/2022)   Received from [facility]   Overall Financial Resource Strain (CARDIA)  Food Insecurity: Low Risk  (05/08/2023)   [facility]   Hunger Vital Sign    Worried About Running Out of Food in the Last Year: Never true    Ran Out of Food in the Last Year: Never true  Transportation Needs: Not on file (05/08/2023)  Physical Activity: Sufficiently Active (05/09/2022)   Received from [facility]   Exercise Vital Sign  Stress: No Stress Concern Present (05/09/2022)   Received from [facility]   [employer] of [employer] - Occupational Stress Questionnaire  Social Connections: Socially Integrated (05/09/2022)   Received from [facility]   Social Connection and Isolation Panel [NHANES]   Family History  Problem Relation Age of Onset   Breast cancer Neg Hx    No Known Allergies Prior to Admission medications   Medication Sig Start Date End Date Taking? Authorizing Provider  aspirin  (ASPIRIN  81) 81 MG chewable tablet Chew 1 tablet (81 mg total) by mouth 2 (two) times daily. To be taken after surgery to prevent blood clots 02/21/24   [name], PA-C  Cholecalciferol 50 MCG (2000 UT) CHEW Chew 2,000 Units by mouth daily.   Yes [provider]  Cyanocobalamin ([employer] B12 GUMMIES PO) Take 1 tablet by mouth daily.   Yes [provider]  diclofenac  (VOLTAREN ) 75 MG EC tablet TAKE 1 TABLET BY MOUTH 3 TIMES DAILY AS NEEDED (BETWEEN MEALS & AT BEDTIME FOR MODERATE PAIN) AS DIRECTED Patient taking differently: Take 75 mg by mouth 2 (two) times daily as needed for moderate pain (pain score 4-6). 07/28/23  Yes Syliva Even, MD  docusate sodium  (COLACE) 100 MG capsule Take 1 capsule (100 mg total) by mouth daily as needed. 02/21/24 02/20/25  [name], PA-C  gabapentin  (NEURONTIN ) 300  MG capsule Take 1 capsule (300 mg total) by mouth at bedtime. Patient taking differently: Take 600 mg by mouth at bedtime. 04/18/23  Yes Syliva Even, MD  methocarbamol  (ROBAXIN -750) 750 MG tablet Take 1 tablet (750 mg total) by mouth 3 (three) times daily as needed for muscle spasms. 02/21/24   [name], PA-C  Multiple Vitamin (MULTIVITAMIN WITH MINERALS) TABS tablet Take 1 tablet by mouth daily.   Yes [provider]  omeprazole (PRILOSEC) 20 MG capsule Take 20 mg by mouth daily.   Yes [provider]  ondansetron  (ZOFRAN ) 4 MG tablet Take 1 tablet (4 mg total) by mouth every 8 (eight) hours as needed for nausea or vomiting. 02/21/24   [name], PA-C   oxyCODONE -acetaminophen  (PERCOCET) 5-325 MG tablet Take 1-2 tablets by mouth every 6 (six) hours as needed. To be taken after surgery 02/21/24   [name], PA-C  Polyethyl Glycol-Propyl Glycol (SYSTANE OP) Place 1 drop into both eyes as needed (dry eyes).   Yes [provider]  progesterone (PROMETRIUM) 100 MG capsule Take 100 mg by mouth at bedtime. 01/31/24  Yes [provider]  TURMERIC CURCUMIN PO Take 400 mg by mouth daily.   Yes [provider]  Ferrous Sulfate Dried (FERROUS SULFATE IRON PO) Take 65 mg by mouth every morning.    [provider]  predniSONE  (DELTASONE ) 50 MG tablet Take 1 tablet (50 mg total) by mouth daily. Patient not taking: Reported on 02/14/2024 12/27/23   Corey, Evan S, MD  tiZANidine  (ZANAFLEX ) 2 MG tablet Take 1-2 tablets (2-4 mg total) by mouth every 8 (eight) hours as needed for muscle spasms. Patient not taking: Reported on 11/13/2023 04/18/23   Syliva Even, MD     Positive ROS: All other systems have been reviewed and were otherwise negative with the exception of those mentioned in the HPI and as above.  Physical Exam: General: Alert, no acute distress Cardiovascular: No pedal edema Respiratory: No cyanosis, no use of accessory musculature GI: abdomen soft Skin: No lesions in the area of chief complaint Neurologic: Sensation intact distally Psychiatric: Patient is competent for consent with normal mood and affect Lymphatic: no lymphedema  MUSCULOSKELETAL: exam stable  Assessment: left hip osteoarthritis  Plan: Plan for Procedure(s): ARTHROPLASTY, HIP, TOTAL, ANTERIOR APPROACH  The risks benefits and alternatives were discussed with the patient including but not limited to the risks of nonoperative treatment, versus surgical intervention including infection, bleeding, nerve injury,  blood clots, cardiopulmonary complications, morbidity, mortality, among others, and they were willing to proceed.   Claria Crofts,  MD 02/26/2024 8:43 AM

## 2024-02-26 NOTE — Transfer of Care (Signed)
 Immediate Anesthesia Transfer of Care Note  Patient: Sandra Blair  Procedure(s) Performed: ARTHROPLASTY, HIP, TOTAL, ANTERIOR APPROACH (Left: Hip)  Patient Location: PACU  Anesthesia Type:MAC and Spinal  Level of Consciousness: awake, alert , and oriented  Airway & Oxygen Therapy: Patient Spontanous Breathing  Post-op Assessment: Report given to RN and Post -op Vital signs reviewed and stable  Post vital signs: Reviewed and stable  Last Vitals:  Vitals Value Taken Time  BP 117/62 02/26/24 1223  Temp    Pulse 62 02/26/24 1225  Resp 8 02/26/24 1225  SpO2 96 % 02/26/24 1225  Vitals shown include unfiled device data.  Last Pain:  Vitals:   02/26/24 0858  TempSrc:   PainSc: 3       Patients Stated Pain Goal: 0 (02/26/24 0858)  Complications: No notable events documented.

## 2024-02-26 NOTE — Evaluation (Signed)
 Physical Therapy Evaluation Patient Details Name: Sandra Blair MRN: [id number] DOB: 09-23-1966 Today's Date: 02/26/2024  History of Present Illness  58 y.o. female presents to [facility] hospital on 02/26/2024 for elective L THA. PMH is largely unremarkable.  Clinical Impression  Pt presents to PT with deficits in functional mobility, gait, balance, strength, ROM. Pt is able to ambulate for household distances with support of the RW. Pt with nausea and one instance of emesis during mobility, RN aware. PT provides education on the THA exercise packet and encourages frequent mobilization with staff assistance. PT will follow up tomorrow for a progression of gait and to initiate stair training.          If plan is discharge home, recommend the following: A little help with walking and/or transfers;A little help with bathing/dressing/bathroom;Assistance with cooking/housework;Assist for transportation;Help with stairs or ramp for entrance   Can travel by private vehicle        Equipment Recommendations None recommended by PT  Recommendations for Other Services       Functional Status Assessment Patient has had a recent decline in their functional status and demonstrates the ability to make significant improvements in function in a reasonable and predictable amount of time.     Precautions / Restrictions Precautions Precautions: Fall Recall of Precautions/Restrictions: Intact Precaution/Restrictions Comments: direct anterior THA Restrictions Weight Bearing Restrictions Per Provider Order: Yes LLE Weight Bearing Per Provider Order: Weight bearing as tolerated      Mobility  Bed Mobility Overal bed mobility: Needs Assistance Bed Mobility: Supine to Sit, Sit to Supine     Supine to sit: Contact guard, HOB elevated, Used rails Sit to supine: Contact guard assist   General bed mobility comments: verbal cues to utilize RLE to assist LLE    Transfers Overall transfer level: Needs  assistance Equipment used: Rolling walker (2 wheels) Transfers: Sit to/from Stand Sit to Stand: Contact guard assist                Ambulation/Gait Ambulation/Gait assistance: Contact guard assist Gait Distance (Feet): 60 Feet Assistive device: Rolling walker (2 wheels) Gait Pattern/deviations: Step-through pattern Gait velocity: reduced Gait velocity interpretation: <1.31 ft/sec, indicative of household ambulator   General Gait Details: step-to gait initially, progressing to step-through with verbal cues from PT.  Stairs            Wheelchair Mobility     Tilt Bed    Modified Rankin (Stroke Patients Only)       Balance Overall balance assessment: Needs assistance Sitting-balance support: No upper extremity supported, Feet supported Sitting balance-Leahy Scale: Good     Standing balance support: Bilateral upper extremity supported, Reliant on assistive device for balance Standing balance-Leahy Scale: Poor                               Pertinent Vitals/Pain Pain Assessment Pain Assessment: 0-10 Pain Score: 2  Pain Location: L hip Pain Descriptors / Indicators: Grimacing Pain Intervention(s): Monitored during session    Home Living Family/patient expects to be discharged to:: Private residence Living Arrangements: Spouse/significant other Available Help at Discharge: Family;Available 24 hours/day Type of Home: House Home Access: Stairs to enter Entrance Stairs-Rails: None Entrance Stairs-Number of Steps: 2 Alternate Level Stairs-Number of Steps: 14 Home Layout: Two level Home Equipment: Agricultural consultant (2 wheels);Toilet riser;Crutches;Cane - single point;Shower seat      Prior Function Prior Level of Function : Independent/Modified Independent;Working/employed;Driving  Mobility Comments: working as an Publishing rights manager at [employer] Assessment   Upper Extremity Assessment Upper Extremity  Assessment: Overall WFL for tasks assessed    Lower Extremity Assessment Lower Extremity Assessment: LLE deficits/detail LLE Deficits / Details: generalized post-op weakness as anticipated on POD 0 s/p THA    Cervical / Trunk Assessment Cervical / Trunk Assessment: Normal  Communication   Communication Communication: No apparent difficulties    Cognition Arousal: Alert Behavior During Therapy: WFL for tasks assessed/performed   PT - Cognitive impairments: No apparent impairments                         Following commands: Intact       Cueing Cueing Techniques: Verbal cues     General Comments General comments (skin integrity, edema, etc.): VSS on RA, pt with nausea and one episode of emesis during session    Exercises Other Exercises Other Exercises: PT provides education on surgical hip exercise packet   Assessment/Plan    PT Assessment Patient needs continued PT services  PT Problem List Decreased strength;Decreased activity tolerance;Decreased balance;Decreased mobility;Decreased knowledge of use of DME;Pain       PT Treatment Interventions DME instruction;Gait training;Stair training;Functional mobility training;Therapeutic activities;Therapeutic exercise;Balance training;Neuromuscular re-education;Patient/family education    PT Goals (Current goals can be found in the Care Plan section)  Acute Rehab PT Goals Patient Stated Goal: to return to independence PT Goal Formulation: With patient/family Time For Goal Achievement: 03/01/24 Potential to Achieve Goals: Good    Frequency 7X/week     Co-evaluation               AM-PAC PT "6 Clicks" Mobility  Outcome Measure Help needed turning from your back to your side while in a flat bed without using bedrails?: A Little Help needed moving from lying on your back to sitting on the side of a flat bed without using bedrails?: A Little Help needed moving to and from a bed to a chair (including a  wheelchair)?: A Little Help needed standing up from a chair using your arms (e.g., wheelchair or bedside chair)?: A Little Help needed to walk in hospital room?: A Little Help needed climbing 3-5 steps with a railing? : A Lot 6 Click Score: 17    End of Session Equipment Utilized During Treatment: Gait belt Activity Tolerance: Patient tolerated treatment well Patient left: in bed;with call bell/phone within reach;with family/visitor present Nurse Communication: Mobility status PT Visit Diagnosis: Other abnormalities of gait and mobility (R26.89);Muscle weakness (generalized) (M62.81)    Time: 8119-1478 PT Time Calculation (min) (ACUTE ONLY): 31 min   Charges:   PT Evaluation $PT Eval Low Complexity: 1 Low   PT General Charges $$ ACUTE PT VISIT: 1 Visit         Rexie Catena, PT, DPT Acute Rehabilitation Office [phone]   Rexie Catena 02/26/2024, 4:31 PM

## 2024-02-26 NOTE — Discharge Instructions (Signed)

## 2024-02-26 NOTE — Op Note (Addendum)
 ARTHROPLASTY, HIP, TOTAL, ANTERIOR APPROACH  Procedure Note Sandra Blair   [id number]  Pre-op Diagnosis: left hip osteoarthritis     Post-op Diagnosis: same  Operative Findings Complete loss of articular cartilage   Operative Procedures  1. Total hip replacement; Left hip; uncemented [id number]   Surgeon: Dyana Glade, M.D.  Assist: Sharran Decent, PA-C   Anesthesia: spinal  Prosthesis: Depuy Acetabulum: Pinnacle 54 mm Femur: Actis 4 Head: 36 mm size: +5 Liner: +4 Bearing Type: ceramic/poly  Total Hip Arthroplasty (Anterior Approach) Op Note:  After informed consent was obtained and the operative extremity marked in the holding area, the patient was brought back to the operating room and placed supine on the HANA table. Next, the operative extremity was prepped and draped in normal sterile fashion. Surgical timeout occurred verifying patient identification, surgical site, surgical procedure and administration of antibiotics.  A bikini incision was made.  A Hueter approach to the hip was performed, using the interval between tensor fascia lata and sartorius.  Dissection was carried bluntly down onto the anterior hip capsule. The lateral femoral circumflex vessels were identified and coagulated. A capsulotomy was performed and the capsular flaps tagged for later repair.  The neck osteotomy was performed. The femoral head was removed which showed severe wear, the acetabular rim was cleared of soft tissue and osteophytes and attention was turned to reaming the acetabulum.  Sequential reaming was performed under fluoroscopic guidance.  She had medial wear past the tear drop.  I carefully reamed to a size 53 mm paying close attention not to ream through the medial wall, and then impacted the acetabular shell.   The liner was then placed after irrigation and attention turned to the femur.  After placing the femoral hook, the leg was taken to externally rotated, extended and  adducted position taking care to perform soft tissue releases to allow for adequate mobilization of the femur. Soft tissue was cleared from the shoulder of the greater trochanter and the hook elevator used to improve exposure of the proximal femur. Sequential broaching performed up to a size 4. Standard trial neck and +1.5 head were placed. The leg was brought back up to neutral and the construct reduced.  The position and sizing of components, offset and leg lengths were checked using fluoroscopy.  I felt she needed a bit more length and offset which could be accomplished by going up to a +5 head ball.  Stability of the construct was checked in 45 degrees of hip extension and 90 degrees of external rotation without any subluxation, shuck or impingement of prosthesis. We dislocated the prosthesis, dropped the leg back into position, removed trial components, and irrigated copiously.  The final stem and head was then placed, the leg brought back up, the system reduced and fluoroscopy used to verify positioning.  Antibiotic irrigation was placed in the surgical wound.   We irrigated, obtained hemostasis and closed the capsule using #2 ethibond suture.  A topical mixture of 0.25% bupivacaine and meloxicam was placed deep to the fascia.  One gram of vancomycin powder was placed in the surgical bed.   One gram of topical tranexamic acid was injected into the joint.  The fascia was closed with #1 stratafix, the deep fat layer was closed with 0 vicryl, the subcutaneous layers closed with 2.0 Vicryl Plus and the skin closed with 3.0 monocryl and steri strips. A sterile dressing was applied. The patient was awakened in the operating room and taken to recovery in  stable condition.  All sponge, needle, and instrument counts were correct at the end of the case.   Sandra Blair, my PA, was a medical necessity for opening, closing, limb positioning, retracting, exposing, and overall facilitation and timely completion of  the surgery.  Position: supine  Complications: see description of procedure.  Time Out: performed   Drains/Packing: none  Estimated blood loss: see anesthesia record  Returned to Recovery Room: in good condition.   Antibiotics: yes   Mechanical VTE (DVT) Prophylaxis: sequential compression devices, TED thigh-high  Chemical VTE (DVT) Prophylaxis: eliquis   Fluid Replacement: see anesthesia record  Specimens Removed: 1 to pathology   Sponge and Instrument Count Correct? yes   PACU: portable radiograph - low AP   Plan/RTC: Return in 2 weeks for staple removal. Weight Bearing/Load Lower Extremity: full  Hip precautions: none Suture Removal: 2 weeks   N. Claria Crofts, MD Sandra Blair 1:30 PM   Implant Name Type Inv. Item Serial No. Manufacturer Lot No. LRB No. Used Action  CUP ACETAB W/GRIPTION 54 - [id number] Plate CUP ACETAB W/GRIPTION 54  [employer] [id number] Left 1 Implanted  LINER NEUTRAL 54X36MM PLUS 4 - [id number] Hips LINER NEUTRAL 54X36MM PLUS 4  [employer] [id number] Left 1 Implanted  HEAD CERAMIC 36 PLUS5 - [id number] Hips HEAD CERAMIC 36 PLUS5  [employer] [id number] Left 1 Implanted  STEM FEM ACTIS STD SZ4 - [id number] Stem STEM FEM ACTIS STD SZ4  [employer] [id number] Left 1 Implanted

## 2024-02-26 NOTE — Anesthesia Postprocedure Evaluation (Addendum)
 Anesthesia Post Note  Patient: Sandra Blair  Procedure(s) Performed: ARTHROPLASTY, HIP, TOTAL, ANTERIOR APPROACH (Left: Hip)     Patient location during evaluation: PACU Anesthesia Type: Spinal Level of consciousness: oriented, awake and alert and patient cooperative Pain management: pain level controlled Vital Signs Assessment: post-procedure vital signs reviewed and stable Respiratory status: spontaneous breathing, respiratory function stable and nonlabored ventilation Cardiovascular status: blood pressure returned to baseline and stable Postop Assessment: no headache, no backache, no apparent nausea or vomiting, spinal receding and patient able to bend at knees Anesthetic complications: no   No notable events documented.  Last Vitals:  Vitals:   02/26/24 1300 02/26/24 1320  BP: 96/70 121/72  Pulse: 61 60  Resp: 20 20  Temp: 36.6 C 36.6 C  SpO2: 97% 92%    Last Pain:  Vitals:   02/26/24 1320  TempSrc: Oral  PainSc:                  Elanie Hammitt,E. Bladen Umar

## 2024-02-26 NOTE — Anesthesia Preprocedure Evaluation (Addendum)
 Anesthesia Evaluation  Patient identified by MRN, date of birth, ID band Patient awake    Reviewed: Allergy & Precautions, NPO status , Patient's Chart, lab work & pertinent test results  History of Anesthesia Complications Negative for: history of anesthetic complications  Airway Mallampati: I  TM Distance: >3 FB Neck ROM: Full    Dental  (+) Dental Advisory Given   Pulmonary former smoker   breath sounds clear to auscultation       Cardiovascular (-) angina negative cardio ROS  Rhythm:Regular Rate:Normal     Neuro/Psych negative neurological ROS     GI/Hepatic Neg liver ROS,GERD  Medicated and Controlled,,  Endo/Other  negative endocrine ROS    Renal/GU negative Renal ROS     Musculoskeletal  (+) Arthritis ,    Abdominal   Peds  Hematology Hb 14.0, plt 252k   Anesthesia Other Findings   Reproductive/Obstetrics                             Anesthesia Physical Anesthesia Plan  ASA: 2  Anesthesia Plan: Spinal   Post-op Pain Management: Tylenol  PO (pre-op)*   Induction:   PONV Risk Score and Plan: 2 and Treatment may vary due to age or medical condition  Airway Management Planned: Natural Airway and Simple Face Mask  Additional Equipment: None  Intra-op Plan:   Post-operative Plan:   Informed Consent: I have reviewed the patients History and Physical, chart, labs and discussed the procedure including the risks, benefits and alternatives for the proposed anesthesia with the patient or authorized representative who has indicated his/her understanding and acceptance.     Dental advisory given  Plan Discussed with: CRNA and Surgeon  Anesthesia Plan Comments:         Anesthesia Quick Evaluation

## 2024-02-26 NOTE — Anesthesia Procedure Notes (Signed)
 Spinal  End time: 02/26/2024 10:29 AM Reason for block: surgical anesthesia Staffing Performed: anesthesiologist  Anesthesiologist: Jonne Netters, MD Performed by: Jonne Netters, MD Authorized by: Jonne Netters, MD   Preanesthetic Checklist Completed: patient identified, IV checked, site marked, risks and benefits discussed, surgical consent, monitors and equipment checked, pre-op evaluation and timeout performed Spinal Block Patient position: sitting Prep: DuraPrep and site prepped and draped Patient monitoring: blood pressure, continuous pulse ox, cardiac monitor and heart rate Approach: midline Location: L3-4 Injection technique: single-shot Needle Needle type: Pencan and Introducer  Needle gauge: 24 G Needle length: 9 cm Assessment Events: CSF return Additional Notes Pt identified in Operating room.  Monitors applied. Working IV access confirmed. Sterile prep, drape lumbar spine.  1% lido local L 3,4.  #24ga Pencan into clear CSF L 3,4.  13.5 mg 0.75% Bupivacaine with dextrose injected with asp CSF beginning and end of injection.  Patient asymptomatic, VSS, no heme aspirated, tolerated well.  Fay Hoop, MD

## 2024-02-27 ENCOUNTER — Encounter (HOSPITAL_COMMUNITY): Payer: Self-pay | Admitting: Orthopaedic Surgery

## 2024-02-27 ENCOUNTER — Other Ambulatory Visit: Payer: Self-pay | Admitting: Physician Assistant

## 2024-02-27 ENCOUNTER — Other Ambulatory Visit (HOSPITAL_COMMUNITY): Payer: Self-pay

## 2024-02-27 DIAGNOSIS — M1612 Unilateral primary osteoarthritis, left hip: Secondary | ICD-10-CM | POA: Diagnosis not present

## 2024-02-27 MED ORDER — TRAMADOL HCL 50 MG PO TABS
50.0000 mg | ORAL_TABLET | Freq: Three times a day (TID) | ORAL | 2 refills | Status: AC | PRN
  Filled 2024-02-27: qty 60, 10d supply, fill #0

## 2024-02-27 NOTE — Discharge Summary (Signed)
 Patient ID: Sandra Blair MRN: [id number] DOB/AGE: Jan 09, 1966 58 y.o.  Admit date: 02/26/2024 Discharge date: 02/27/2024  Admission Diagnoses:  Principal Problem:   Primary osteoarthritis of left hip Active Problems:   Status post total replacement of left hip   Discharge Diagnoses:  Same  Past Medical History:  Diagnosis Date   Anemia    GERD (gastroesophageal reflux disease)    Osteoarthritis of left hip 01/02/2024    Surgeries: Procedure(s): ARTHROPLASTY, HIP, TOTAL, ANTERIOR APPROACH on 02/26/2024   Consultants:   Discharged Condition: Improved  Hospital Course: Lecresha Mannering is an 58 y.o. female who was admitted 02/26/2024 for operative treatment ofPrimary osteoarthritis of left hip. Patient has severe unremitting pain that affects sleep, daily activities, and work/hobbies. After pre-op clearance the patient was taken to the operating room on 02/26/2024 and underwent  Procedure(s): ARTHROPLASTY, HIP, TOTAL, ANTERIOR APPROACH.    Patient was given perioperative antibiotics:  Anti-infectives (From admission, onward)    Start     Dose/Rate Route Frequency Ordered Stop   02/26/24 1315  ceFAZolin (ANCEF) IVPB 2g/100 mL premix        2 g 200 mL/hr over 30 Minutes Intravenous Every 6 hours 02/26/24 1308 02/26/24 1905   02/26/24 1106  vancomycin (VANCOCIN) powder  Status:  Discontinued          As needed 02/26/24 1106 02/26/24 1219   02/26/24 0815  ceFAZolin (ANCEF) IVPB 2g/100 mL premix        2 g 200 mL/hr over 30 Minutes Intravenous On call to O.R. 02/26/24 3664 02/26/24 1038        Patient was given sequential compression devices, early ambulation, and chemoprophylaxis to prevent DVT.  Patient benefited maximally from hospital stay and there were no complications.    Recent vital signs: Patient Vitals for the past 24 hrs:  BP Temp Temp src Pulse Resp SpO2  02/27/24 0758 (!) 107/49 99.2 F (37.3 C) Oral 65 16 97 %  02/27/24 0328 (!) 108/53 99.9 F (37.7 C) Oral  66 18 98 %  02/26/24 2239 (!) 103/51 99.6 F (37.6 C) Oral 64 18 95 %  02/26/24 1923 (!) 108/56 98.2 F (36.8 C) Oral 64 18 99 %  02/26/24 1801 121/66 97.8 F (36.6 C) Oral 65 18 100 %  02/26/24 1320 121/72 97.9 F (36.6 C) Oral 60 20 92 %  02/26/24 1300 96/70 97.9 F (36.6 C) -- 61 20 97 %  02/26/24 1245 111/70 -- -- (!) 59 12 94 %  02/26/24 1240 -- -- -- 60 13 97 %  02/26/24 1234 -- -- -- 60 12 99 %  02/26/24 1230 122/71 -- -- 61 10 97 %  02/26/24 1225 117/62 (!) 97.5 F (36.4 C) -- 63 10 97 %     Recent laboratory studies: No results for input(s): "WBC", "HGB", "HCT", "PLT", "NA", "K", "CL", "CO2", "BUN", "CREATININE", "GLUCOSE", "INR", "CALCIUM" in the last 72 hours.  Invalid input(s): "PT", "2"   Discharge Medications:   Allergies as of 02/27/2024   No Known Allergies      Medication List     STOP taking these medications    acetaminophen  325 MG tablet Commonly known as: TYLENOL    diclofenac  75 MG EC tablet Commonly known as: VOLTAREN    oxyCODONE -acetaminophen  5-325 MG tablet Commonly known as: Percocet   predniSONE  50 MG tablet Commonly known as: DELTASONE    tiZANidine  2 MG tablet Commonly known as: ZANAFLEX        TAKE these medications  Cholecalciferol 50 MCG (2000 UT) Chew Chew 2,000 Units by mouth daily.   [employer] B12 GUMMIES PO Take 1 tablet by mouth daily.   docusate sodium  100 MG capsule Commonly known as: Colace Take 1 capsule (100 mg total) by mouth daily as needed.   Eliquis 2.5 MG Tabs tablet Generic drug: apixaban Take 1 tablet (2.5 mg total) by mouth 2 (two) times daily. post-op to prevent blood clots   FERROUS SULFATE IRON PO Take 65 mg by mouth every morning.   gabapentin  300 MG capsule Commonly known as: NEURONTIN  Take 1 capsule (300 mg total) by mouth at bedtime. What changed: how much to take   methocarbamol  750 MG tablet Commonly known as: Robaxin -750 Take 1 tablet (750 mg total) by mouth 3 (three) times daily as  needed for muscle spasms.   multivitamin with minerals Tabs tablet Take 1 tablet by mouth daily.   omeprazole 20 MG capsule Commonly known as: PRILOSEC Take 20 mg by mouth daily.   ondansetron  4 MG tablet Commonly known as: Zofran  Take 1 tablet (4 mg total) by mouth every 8 (eight) hours as needed for nausea or vomiting.   progesterone 100 MG capsule Commonly known as: PROMETRIUM Take 100 mg by mouth at bedtime.   SYSTANE OP Place 1 drop into both eyes as needed (dry eyes).   traMADol 50 MG tablet Commonly known as: ULTRAM Take 1-2 tablets (50-100 mg total) by mouth 3 (three) times daily as needed.   TURMERIC CURCUMIN PO Take 400 mg by mouth daily.               Durable Medical Equipment  (From admission, onward)           Start     Ordered   02/26/24 1309  DME Walker rolling  Once       Question:  Patient needs a walker to treat with the following condition  Answer:  History of hip replacement   02/26/24 1308   02/26/24 1309  DME 3 n 1  Once        02/26/24 1308   02/26/24 1309  DME Bedside commode  Once       Question:  Patient needs a bedside commode to treat with the following condition  Answer:  History of hip replacement   02/26/24 1308            Diagnostic Studies: DG Pelvis Portable Result Date: 02/26/2024 CLINICAL DATA:  Postop in PACU. EXAM: PORTABLE PELVIS 1-2 VIEWS COMPARISON:  None Available. FINDINGS: Left hip arthroplasty in expected alignment. No periprosthetic lucency or fracture. Recent postsurgical change includes air and edema in the soft tissues. IMPRESSION: Left hip arthroplasty without immediate postoperative complication. Electronically Signed   By: Chadwick Colonel M.D.   On: 02/26/2024 15:42   DG HIP UNILAT WITH PELVIS 1V LEFT Result Date: 02/26/2024 CLINICAL DATA:  Elective surgery. EXAM: DG HIP (WITH OR WITHOUT PELVIS) 1V*L* COMPARISON:  None Available. FINDINGS: Single fluoroscopic spot view of the hip submitted from the  operating room. Hip arthroplasty in place. Fluoroscopy time 31.7 seconds. Dose 4.14 mGy. IMPRESSION: Intraoperative fluoroscopy during hip arthroplasty. Electronically Signed   By: Chadwick Colonel M.D.   On: 02/26/2024 12:38   DG C-Arm 1-60 Min-No Report Result Date: 02/26/2024 Fluoroscopy was utilized by the requesting physician.  No radiographic interpretation.   DG C-Arm 1-60 Min-No Report Result Date: 02/26/2024 Fluoroscopy was utilized by the requesting physician.  No radiographic interpretation.   MM 3D SCREENING MAMMOGRAM  BILATERAL BREAST Result Date: 02/06/2024 CLINICAL DATA:  Screening. EXAM: DIGITAL SCREENING BILATERAL MAMMOGRAM WITH TOMOSYNTHESIS AND CAD TECHNIQUE: Bilateral screening digital craniocaudal and mediolateral oblique mammograms were obtained. Bilateral screening digital breast tomosynthesis was performed. The images were evaluated with computer-aided detection. COMPARISON:  Previous exam(s). ACR Breast Density Category b: There are scattered areas of fibroglandular density. FINDINGS: There are no findings suspicious for malignancy. IMPRESSION: No mammographic evidence of malignancy. A result letter of this screening mammogram will be mailed directly to the patient. RECOMMENDATION: Screening mammogram in one year. (Code:[id number]-01Y) BI-RADS CATEGORY  1: Negative. Electronically Signed   By: Rinda Cheers M.D.   On: 02/06/2024 14:18   DG INJECT DIAG/THERA/INC NEEDLE/CATH/PLC EPI/CERV/THOR W/IMG Result Date: 02/01/2024 CLINICAL DATA:  Cervical spondylosis without myelopathy. Cervical radiculitis. Good response to multiple prior cervical epidural injections. Recurrent right shoulder pain and right upper extremity numbness and tingling. FLUOROSCOPY: Radiation Exposure Index (as provided by the fluoroscopic device): 2.0 mGy Kerma PROCEDURE: The procedure, risks, benefits, and alternatives were explained to the patient. Questions regarding the procedure were encouraged and answered. The  patient understands and consents to the procedure. CERVICAL EPIDURAL INJECTION An interlaminar approach was performed on the right at C6-7. A 3.5 inch 20 gauge epidural needle was advanced using loss-of-resistance technique. DIAGNOSTIC EPIDURAL INJECTION Injection of Isovue -M 300 shows a good epidural pattern with spread above and below the level of needle placement primarily on the right. No vascular opacification is seen. THERAPEUTIC EPIDURAL INJECTION 60 mg of Kenalog  mixed with 2 mL of normal saline were then instilled. The procedure was well-tolerated, and the patient was discharged 20 minutes following the injection in good condition. IMPRESSION: Technically successful interlaminar epidural injection on the right at C6-7. Electronically Signed   By: Aundra Lee M.D.   On: 02/01/2024 14:53    Disposition: Discharge disposition: 01-Home or Self Care          Follow-up Information     [name], PA-C. Schedule an appointment as soon as possible for a visit in 2 week(s).   Specialty: Orthopedic Surgery Contact information: [address] [place]  [place] [address] [phone]                  Signed: [name] 02/27/2024, 8:18 AM

## 2024-02-27 NOTE — Progress Notes (Signed)
 Physical Therapy Treatment  Patient Details Name: Sandra Blair MRN: [id number] DOB: August 05, 1966 Today's Date: 02/27/2024   History of Present Illness Pt is a 58 y/o female who presents to [facility] hospital on 02/26/2024 for elective L THA. PMH is largely unremarkable.    PT Comments  Pt progressing towards physical therapy goals. Husband present and supportive throughout session. Focus was therapeutic exercise, gait training, and stair training. Pt able to complete exercises well with cues for technique.  Pt and husband educated on exercise progression, car transfer, and activity progression. Pt is currently functioning at a supervision to mod I level, however will require hands on guarding for stair negotiation. Will continue to follow and progress as able per POC.    If plan is discharge home, recommend the following: A little help with walking and/or transfers;A little help with bathing/dressing/bathroom;Assistance with cooking/housework;Assist for transportation;Help with stairs or ramp for entrance   Can travel by private vehicle        Equipment Recommendations  None recommended by PT    Recommendations for Other Services       Precautions / Restrictions Precautions Precautions: Fall Recall of Precautions/Restrictions: Intact Precaution/Restrictions Comments: direct anterior THA Restrictions Weight Bearing Restrictions Per Provider Order: Yes LLE Weight Bearing Per Provider Order: Weight bearing as tolerated     Mobility  Bed Mobility Overal bed mobility: Modified Independent Bed Mobility: Supine to Sit           General bed mobility comments: No assist required.    Transfers Overall transfer level: Modified independent Equipment used: Rolling walker (2 wheels) Transfers: Sit to/from Stand             General transfer comment: No assist required. Pt demonstrated good hand placement on seated surface for safety.    Ambulation/Gait Ambulation/Gait assistance:  Supervision Gait Distance (Feet): 300 Feet Assistive device: Rolling walker (2 wheels) Gait Pattern/deviations: Step-through pattern, Decreased weight shift to left, Trunk flexed, Decreased dorsiflexion - left Gait velocity: Decreased Gait velocity interpretation: 1.31 - 2.62 ft/sec, indicative of limited community ambulator   General Gait Details: VC's for improved posture, heel strike, and smooth swing through without circumduction. Pt able to make corrective changes.   Stairs Stairs: Yes Stairs assistance: Contact guard assist Stair Management: One rail Right, Step to pattern, Forwards Number of Stairs: 10 General stair comments: VC's for sequencing and general safety.   Wheelchair Mobility     Tilt Bed    Modified Rankin (Stroke Patients Only)       Balance Overall balance assessment: Needs assistance Sitting-balance support: No upper extremity supported, Feet supported Sitting balance-Leahy Scale: Good     Standing balance support: Bilateral upper extremity supported, Reliant on assistive device for balance Standing balance-Leahy Scale: Poor                              Communication Communication Communication: No apparent difficulties  Cognition Arousal: Alert Behavior During Therapy: WFL for tasks assessed/performed   PT - Cognitive impairments: No apparent impairments                         Following commands: Intact      Cueing Cueing Techniques: Verbal cues  Exercises Total Joint Exercises Ankle Circles/Pumps: 10 reps Quad Sets: 10 reps Short Arc Quad: 10 reps Heel Slides: 10 reps Hip ABduction/ADduction: 10 reps, Supine, Standing Long Arc Quad: 10 reps Knee Flexion: 10  reps, Standing Marching in Standing: 10 reps Standing Hip Extension: 10 reps    General Comments        Pertinent Vitals/Pain Pain Assessment Pain Assessment: Faces Faces Pain Scale: Hurts a little bit Pain Location: L hip Pain Descriptors /  Indicators: Operative site guarding, Sore Pain Intervention(s): Limited activity within patient's tolerance, Monitored during session, Repositioned    Home Living                          Prior Function            PT Goals (current goals can now be found in the care plan section) Acute Rehab PT Goals Patient Stated Goal: to return to independence PT Goal Formulation: With patient/family Time For Goal Achievement: 03/01/24 Potential to Achieve Goals: Good Progress towards PT goals: Progressing toward goals    Frequency    7X/week      PT Plan      Co-evaluation              AM-PAC PT "6 Clicks" Mobility   Outcome Measure  Help needed turning from your back to your side while in a flat bed without using bedrails?: A Little Help needed moving from lying on your back to sitting on the side of a flat bed without using bedrails?: A Little Help needed moving to and from a bed to a chair (including a wheelchair)?: A Little Help needed standing up from a chair using your arms (e.g., wheelchair or bedside chair)?: A Little Help needed to walk in hospital room?: A Little Help needed climbing 3-5 steps with a railing? : A Lot 6 Click Score: 17    End of Session Equipment Utilized During Treatment: Gait belt Activity Tolerance: Patient tolerated treatment well Patient left: in bed;with call bell/phone within reach;with family/visitor present Nurse Communication: Mobility status PT Visit Diagnosis: Other abnormalities of gait and mobility (R26.89);Muscle weakness (generalized) (M62.81)     Time: 7846-9629 PT Time Calculation (min) (ACUTE ONLY): 42 min  Charges:    $Gait Training: 8-22 mins $Therapeutic Exercise: 23-37 mins PT General Charges $$ ACUTE PT VISIT: 1 Visit                     Simone Dubois, PT, DPT Acute Rehabilitation Services Secure Chat Preferred Office: [phone]    Sandra Blair 02/27/2024, 11:24 AM

## 2024-02-27 NOTE — TOC Transition Note (Signed)
 Transition of Care [facility]) - Discharge Note   Patient Details  Name: Sandra Blair MRN: [id number] Date of Birth: 1966/05/11  Transition of Care [facility] CM/SW Contact:  Tom-Johnson, Angelique Ken, RN Phone Number: 02/27/2024, 9:17 AM   Clinical Narrative:     Patient is scheduled for discharge today.  Hospital f/u and discharge instructions on AVS. Prescriptions sent to [facility] pharmacy and patient will receive meds prior discharge. Patient declined Home health PT. Unit will provide necessary DME's at discharge. No TOC needs or recommendations noted. Husband, Landon Pinion to transport at discharge.  No further TOC needs noted.       Final next level of care: Home/Self Care (Declines Home health PT) Barriers to Discharge: Barriers Resolved   Patient Goals and CMS Choice Patient states their goals for this hospitalization and ongoing recovery are:: To return home CMS Medicare.gov Compare Post Acute Care list provided to:: Patient Choice offered to / list presented to : NA      Discharge Placement                Patient to be transferred to facility by: Husband Name of family member notified: Landon Pinion    Discharge Plan and Services Additional resources added to the After Visit Summary for                            [facility] Arranged: NA HH Agency: NA        Social Drivers of Health (SDOH) Interventions SDOH Screenings   Food Insecurity: Low Risk  (05/08/2023)   [facility]  Housing: Low Risk  (05/08/2023)   [facility]  Utilities: Low Risk  (05/08/2023)   [facility]  Financial Resource Strain: Low Risk  (05/09/2022)   Received from [facility]  Physical Activity: Sufficiently Active (05/09/2022)   Received from [facility]  Social Connections: Socially Integrated (05/09/2022)   Received from [facility]  Stress: No Stress Concern Present (05/09/2022)   Received from  [facility]  Tobacco Use: Medium Risk (02/16/2024)     Readmission Risk Interventions     No data to display

## 2024-02-27 NOTE — Progress Notes (Addendum)
 Subjective: 1 Day Post-Op Procedure(s) (LRB): ARTHROPLASTY, HIP, TOTAL, ANTERIOR APPROACH (Left) Patient reports pain as mild.  Nausea from oxy  Objective: Vital signs in last 24 hours: Temp:  [97.5 F (36.4 C)-99.9 F (37.7 C)] 99.2 F (37.3 C) (05/06 0758) Pulse Rate:  [59-66] 65 (05/06 0758) Resp:  [10-20] 16 (05/06 0758) BP: (96-122)/(49-72) 107/49 (05/06 0758) SpO2:  [92 %-100 %] 97 % (05/06 0758)  Intake/Output from previous day: 05/05 0701 - 05/06 0700 In: 720 [P.O.:720] Out: 600 [Urine:400; Blood:200] Intake/Output this shift: No intake/output data recorded.  No results for input(s): "HGB" in the last 72 hours. No results for input(s): "WBC", "RBC", "HCT", "PLT" in the last 72 hours. No results for input(s): "NA", "K", "CL", "CO2", "BUN", "CREATININE", "GLUCOSE", "CALCIUM" in the last 72 hours. No results for input(s): "LABPT", "INR" in the last 72 hours.  Neurologically intact Neurovascular intact Sensation intact distally Intact pulses distally Dorsiflexion/Plantar flexion intact Incision: dressing C/D/I No cellulitis present Compartment soft   Assessment/Plan: 1 Day Post-Op Procedure(s) (LRB): ARTHROPLASTY, HIP, TOTAL, ANTERIOR APPROACH (Left) Advance diet Up with therapy D/C IV fluids Discharge home with home health once cleared by PT D/c oxy and added tramadol for d/c Wbat LLE     [name] 02/27/2024, 8:20 AM

## 2024-03-11 ENCOUNTER — Other Ambulatory Visit (HOSPITAL_COMMUNITY): Payer: Self-pay

## 2024-03-12 ENCOUNTER — Ambulatory Visit (INDEPENDENT_AMBULATORY_CARE_PROVIDER_SITE_OTHER): Admitting: Physician Assistant

## 2024-03-12 ENCOUNTER — Encounter: Payer: Self-pay | Admitting: Orthopaedic Surgery

## 2024-03-12 DIAGNOSIS — Z96642 Presence of left artificial hip joint: Secondary | ICD-10-CM

## 2024-03-12 NOTE — Progress Notes (Signed)
   Post-Op Visit Note   Patient: Sandra Blair           Date of Birth: Oct 17, 1966           MRN: [id number] Visit Date: 03/12/2024 PCP: Jayne Mews, MD   Assessment & Plan:  Chief Complaint:  Chief Complaint  Patient presents with   Left Hip - Pain, Routine Post Op   Visit Diagnoses:  1. Status post total replacement of left hip     Plan: Patient is a pleasant 58 year old female who comes in today 2 weeks status post left total hip replacement.  She has been doing well.  She has been taking tramadol  for pain.  She has been compliant taking Eliquis  for DVT prophylaxis.  She is ambulating today unassisted.  Examination of the left hip reveals a well-healed surgical incision.  No evidence of infection or cellulitis.  Calves are soft nontender.  She is neurovascularly intact distally.  Today, new Steri-Strips were applied.  Postoperative instructions provided.  She will finish her Eliquis  which will last her until she is about 4 weeks postop.  She will then transition to a baby aspirin  twice daily for 2 weeks.  Follow-up in 4 weeks for repeat evaluation and AP pelvis x-rays.  Call with concerns or questions.  Follow-Up Instructions: Return in about 4 weeks (around 04/09/2024).   Orders:  No orders of the defined types were placed in this encounter.  No orders of the defined types were placed in this encounter.   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 stress incontinence 10/08/2007   Adiposity 10/08/2007   Menstrual molimen 10/08/2007   Past Medical History:  Diagnosis Date   Anemia    GERD (gastroesophageal reflux disease)    Osteoarthritis of left hip 01/02/2024    Family History  Problem Relation Age of Onset    Breast cancer Neg Hx     Past Surgical History:  Procedure Laterality Date   COLONOSCOPY  11/24/2023   DIAGNOSTIC LAPAROSCOPY Left    oopherectomy and ablation   FOOT OSTEOTOMY Left    patient denies this procedire on 02/16/24 but states had second toe surgery on left foot   TONSILLECTOMY AND ADENOIDECTOMY     TOTAL HIP ARTHROPLASTY Left 02/26/2024   Procedure: ARTHROPLASTY, HIP, TOTAL, ANTERIOR APPROACH;  Surgeon: Wes Hamman, MD;  Location: [facility] OR;  Service: Orthopedics;  Laterality: Left;  3-C   TUBAL LIGATION     WISDOM TOOTH EXTRACTION     Social History   Occupational History   Occupation: Child psychotherapist: [employer]    Comment: PRN  Tobacco Use   Smoking status: Former    Current packs/day: 1.00    Types: Cigarettes   Smokeless tobacco: Never   Tobacco comments:    Quit smoking in 12/1995  Vaping Use   Vaping status: Never Used  Substance and Sexual Activity   Alcohol use: Not Currently    Comment: occ   Drug use: No   Sexual activity: Yes    Birth control/protection: Post-menopausal

## 2024-04-09 ENCOUNTER — Other Ambulatory Visit (INDEPENDENT_AMBULATORY_CARE_PROVIDER_SITE_OTHER)

## 2024-04-09 ENCOUNTER — Ambulatory Visit (INDEPENDENT_AMBULATORY_CARE_PROVIDER_SITE_OTHER): Admitting: Orthopaedic Surgery

## 2024-04-09 DIAGNOSIS — Z96642 Presence of left artificial hip joint: Secondary | ICD-10-CM

## 2024-04-09 NOTE — Progress Notes (Signed)
 Post-Op Visit Note   Patient: Sandra Blair           Date of Birth: Nov 06, 1965           MRN: [id number] Visit Date: 04/09/2024 PCP: Jayne Mews, MD   Assessment & Plan:  Chief Complaint:  Chief Complaint  Patient presents with   Left Hip - Routine Post Op   Visit Diagnoses:  1. Status post total replacement of left hip     Plan: History of Present Illness Sandra Blair is a 58 year old female who presents for a 6 week follow-up visit for right hip replacement surgery.  She is six weeks post-operative from hip replacement surgery. Her hip feels improved but experiences soreness on some days. There is a sensation of 'catch' with extra movements, though less severe than before. Pain is deeper rather than superficial.  She can sleep on her left side and back, which is an improvement. She walked approximately sixteen thousand steps in [place] resulting in increased fatigue and soreness the following day. She also walked around her block, reaching ten thousand steps on two days, with subsequent tiredness.  Numbness in her thigh has improved since surgery, reducing to a smaller area with sensation returning, causing tenderness.  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 She is currently out of work for six more weeks due to restrictions on lifting and moving patients. She wears compression socks during long car rides and takes blood thinners.  Physical Exam SKIN: Surgical scar on hip well-healed.  Fluid painless range of motion.  Assessment and Plan Status post left total hip arthroplasty Six weeks post-surgery with soreness and occasional catching sensation. X-ray shows good healing. Advised against yoga due to dislocation risk. Discussed gradual return to exercise. - Allow walking, stationary biking, elliptical use based on  comfort. - Advise against yoga indefinitely. - Permit swimming and hot tub use after three months. - Allow regular bathtub use. - Discontinue regular blood thinners; consider for long car rides. - Permit driving and household activities based on comfort. - Advise Mederma on scar, avoid sun exposure for one year. - Prescribe antibiotics prior to dental cleaning. - Permit return to hormone replacement therapy. - Advise gradual return to weightlifting and body weight exercises. - Schedule follow-up in six weeks. - Patient would benefit from 6 more weeks of being out of work to improve hip function and strengthening as her job is physically demanding.  Follow-Up Instructions: Return in about 6 weeks (around 05/21/2024) for with lindsey.   Orders:  Orders Placed This Encounter  Procedures   XR Pelvis 1-2 Views   No orders of the defined types were placed in this encounter.   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 10/08/2007   Adiposity 10/08/2007   Menstrual molimen 10/08/2007   Past Medical History:  Diagnosis Date   Anemia    GERD (gastroesophageal reflux disease)    Osteoarthritis of left hip 01/02/2024    Family History  Problem Relation Age of Onset   Breast cancer  Neg Hx     Past Surgical History:  Procedure Laterality Date   COLONOSCOPY  11/24/2023   DIAGNOSTIC LAPAROSCOPY Left    oopherectomy and ablation   FOOT OSTEOTOMY Left    patient denies this procedire on 02/16/24 but states had second toe surgery on left foot   TONSILLECTOMY AND ADENOIDECTOMY     TOTAL HIP ARTHROPLASTY Left 02/26/2024   Procedure:  ARTHROPLASTY, HIP, TOTAL, ANTERIOR APPROACH;  Surgeon: Wes Hamman, MD;  Location: [facility] OR;  Service: Orthopedics;  Laterality: Left;  3-C   TUBAL LIGATION     WISDOM TOOTH EXTRACTION     Social History   Occupational History   Occupation: Child psychotherapist: [employer]    Comment: PR[employer]  Tobacco Use   Smoking status: Former    Current packs/day: 1.00    Types: Cigarettes   Smokeless tobacco: Never   Tobacco comments:    Quit smoking in 12/1995  Vaping Use   Vaping status: Never Used  Substance and Sexual Activity   Alcohol use: Not Currently    Comment: occ   Drug use: No   Sexual activity: Yes    Birth control/protection: Post-menopausal

## 2024-05-15 ENCOUNTER — Encounter: Admitting: Physician Assistant

## 2024-05-16 ENCOUNTER — Ambulatory Visit (INDEPENDENT_AMBULATORY_CARE_PROVIDER_SITE_OTHER): Admitting: Physician Assistant

## 2024-05-16 ENCOUNTER — Encounter: Payer: Self-pay | Admitting: Physician Assistant

## 2024-05-16 DIAGNOSIS — Z96642 Presence of left artificial hip joint: Secondary | ICD-10-CM

## 2024-05-16 MED ORDER — AMOXICILLIN 500 MG PO CAPS: ORAL_CAPSULE | ORAL | 2 refills | Status: AC

## 2024-05-16 NOTE — Progress Notes (Signed)
   Post-Op Visit Note   Patient: Sandra Blair           Date of Birth: 1966/10/18           MRN: [id number] Visit Date: 05/16/2024 PCP: Millicent Sharper, MD   Assessment & Plan:  Chief Complaint:  Chief Complaint  Patient presents with   Left Hip - Follow-up    Left total hip arthroplasty 02/26/2024   Visit Diagnoses:  1. Status post total replacement of left hip     Plan: Patient is a pleasant 58 year old female who comes in today 3 months status post left total hip replacement 02/26/2024.  She has been doing well with the left hip.  Examination of left hip reveals painless hip flexion and logroll.  She is neurovascularly intact distally.  At this point, she will continue to [place] with activity as tolerated.  Dental prophylaxis reinforced.  She will follow-up in 9 months for repeat evaluation and AP pelvis x-rays.  Call with concerns or questions.  Follow-Up Instructions: Return in about 9 months (around 02/14/2025).   Orders:  No orders of the defined types were placed in this encounter.  Meds ordered this encounter  Medications   amoxicillin  (AMOXIL ) 500 MG capsule    Sig: Take 4 pills one hour prior to dental work    Dispense:  8 capsule    Refill:  2    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 stress incontinence 10/08/2007   Adiposity 10/08/2007   Menstrual molimen 10/08/2007   Past Medical History:  Diagnosis Date   Anemia    GERD (gastroesophageal reflux disease)    Osteoarthritis of left hip 01/02/2024    Family History  Problem Relation Age of Onset   Breast cancer Neg Hx     Past Surgical History:  Procedure Laterality Date   COLONOSCOPY  11/24/2023   DIAGNOSTIC LAPAROSCOPY Left    oopherectomy and  ablation   FOOT OSTEOTOMY Left    patient denies this procedire on 02/16/24 but states had second toe surgery on left foot   TONSILLECTOMY AND ADENOIDECTOMY     TOTAL HIP ARTHROPLASTY Left 02/26/2024   Procedure: ARTHROPLASTY, HIP, TOTAL, ANTERIOR APPROACH;  Surgeon: Jerri Kay HERO, MD;  Location: [facility] OR;  Service: Orthopedics;  Laterality: Left;  3-C   TUBAL LIGATION     WISDOM TOOTH EXTRACTION     Social History   Occupational History   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

## 2024-05-27 ENCOUNTER — Encounter: Payer: Self-pay | Admitting: Orthopaedic Surgery

## 2024-06-25 ENCOUNTER — Encounter: Payer: Self-pay | Admitting: Family Medicine

## 2024-06-25 DIAGNOSIS — M5412 Radiculopathy, cervical region: Secondary | ICD-10-CM

## 2024-06-26 NOTE — Telephone Encounter (Signed)
 Order for neck injection and RX's pending.   Forwarding to Dr. Joane to review, as these meds have not previously been prescribed by him.    BMP 02/16/24       Component Ref Range & Units (hover) 4 mo ago 9 yr ago  Sodium 139 139 R  Potassium 3.9 4.3 R, CM  Chloride 102 101 R  CO2 29 24 R  Glucose, Bld 97 89  Comment: Glucose reference range applies only to samples taken after fasting for at least 8 hours.  BUN 13 12 R  Creatinine, Ser 0.72 0.63 R  Calcium 9.1 8.8 R  GFR, Estimated >60   Comment: (NOTE) Calculated using the CKD-EPI Creatinine Equation (2021)  Anion gap 8 14  Comment: Performed at [facility], [address] [place], [place] [address]

## 2024-06-27 MED ORDER — DICLOFENAC SODIUM 75 MG PO TBEC
75.0000 mg | DELAYED_RELEASE_TABLET | Freq: Two times a day (BID) | ORAL | 3 refills | Status: AC | PRN
Start: 2024-06-27 — End: ?

## 2024-06-27 MED ORDER — GABAPENTIN 300 MG PO CAPS: 600.0000 mg | ORAL_CAPSULE | Freq: Every day | ORAL | 3 refills | Status: AC

## 2024-07-01 ENCOUNTER — Encounter: Payer: Self-pay | Admitting: Family Medicine

## 2024-07-02 ENCOUNTER — Ambulatory Visit
Admission: RE | Admit: 2024-07-02 | Discharge: 2024-07-02 | Disposition: A | Discharge status: Home / Self Care | Source: Ambulatory Visit | Attending: Family Medicine | Admitting: Family Medicine

## 2024-07-02 DIAGNOSIS — M5412 Radiculopathy, cervical region: Secondary | ICD-10-CM

## 2024-07-02 MED ORDER — IOPAMIDOL (ISOVUE-M 300) INJECTION 61%
1.0000 mL | Freq: Once | INTRAMUSCULAR | Status: AC
Start: 2024-07-02 — End: 2024-07-02
  Administered 2024-07-02: 1 mL via EPIDURAL

## 2024-07-02 MED ORDER — TRIAMCINOLONE ACETONIDE 40 MG/ML IJ SUSP (RADIOLOGY)
60.0000 mg | Freq: Once | INTRAMUSCULAR | Status: AC
Start: 2024-07-02 — End: 2024-07-02
  Administered 2024-07-02: 60 mg via EPIDURAL

## 2024-08-26 ENCOUNTER — Encounter: Payer: Self-pay | Admitting: Radiology

## 2024-09-27 ENCOUNTER — Encounter: Payer: Self-pay | Admitting: Family Medicine

## 2024-09-27 DIAGNOSIS — M5412 Radiculopathy, cervical region: Secondary | ICD-10-CM

## 2024-10-11 ENCOUNTER — Encounter: Payer: Self-pay | Admitting: Family Medicine

## 2024-10-14 NOTE — Discharge Instructions (Signed)

## 2024-10-15 ENCOUNTER — Ambulatory Visit
Admission: RE | Admit: 2024-10-15 | Discharge: 2024-10-15 | Disposition: A | Discharge status: Home / Self Care | Source: Ambulatory Visit | Attending: Family Medicine | Admitting: Family Medicine

## 2024-10-15 DIAGNOSIS — M5412 Radiculopathy, cervical region: Secondary | ICD-10-CM

## 2024-10-15 MED ORDER — IOPAMIDOL (ISOVUE-M 300) INJECTION 61%
1.0000 mL | Freq: Once | INTRAMUSCULAR | Status: AC
  Administered 2024-10-15: 1 mL via EPIDURAL

## 2024-10-15 MED ORDER — TRIAMCINOLONE ACETONIDE 40 MG/ML IJ SUSP (RADIOLOGY)
60.0000 mg | Freq: Once | INTRAMUSCULAR | Status: AC
  Administered 2024-10-15: 60 mg via EPIDURAL

## 2024-12-06 ENCOUNTER — Ambulatory Visit: Payer: Self-pay | Admitting: Family Medicine
